# Patient Record
Sex: Male | Born: 1960
Health system: Southern US, Community
[De-identification: ages and names within clinical notes are randomized; demographics above are authoritative.]

## PROBLEM LIST (undated history)

## (undated) DIAGNOSIS — M1990 Unspecified osteoarthritis, unspecified site: Secondary | ICD-10-CM

## (undated) DIAGNOSIS — Z8719 Personal history of other diseases of the digestive system: Secondary | ICD-10-CM

## (undated) DIAGNOSIS — E785 Hyperlipidemia, unspecified: Secondary | ICD-10-CM

## (undated) HISTORY — PX: FINGER SURGERY: SHX640

## (undated) HISTORY — DX: Hyperlipidemia, unspecified: E78.5

---

## 1999-02-17 ENCOUNTER — Ambulatory Visit (HOSPITAL_COMMUNITY): Admission: RE | Admit: 1999-02-17 | Discharge: 1999-02-18 | Payer: Self-pay | Admitting: *Deleted

## 2000-10-10 ENCOUNTER — Emergency Department (HOSPITAL_COMMUNITY): Admission: EM | Admit: 2000-10-10 | Discharge: 2000-10-10 | Payer: Self-pay | Admitting: Family Medicine

## 2000-11-18 ENCOUNTER — Encounter: Payer: Self-pay | Admitting: *Deleted

## 2000-11-18 ENCOUNTER — Emergency Department (HOSPITAL_COMMUNITY): Admission: EM | Admit: 2000-11-18 | Discharge: 2000-11-19 | Payer: Self-pay | Admitting: *Deleted

## 2000-11-22 ENCOUNTER — Ambulatory Visit (HOSPITAL_COMMUNITY): Admission: RE | Admit: 2000-11-22 | Discharge: 2000-11-22 | Payer: Self-pay | Admitting: Cardiology

## 2003-01-25 ENCOUNTER — Emergency Department (HOSPITAL_COMMUNITY): Admission: EM | Admit: 2003-01-25 | Discharge: 2003-01-25 | Payer: Self-pay | Admitting: Emergency Medicine

## 2005-02-22 ENCOUNTER — Ambulatory Visit (HOSPITAL_COMMUNITY): Admission: RE | Admit: 2005-02-22 | Discharge: 2005-02-22 | Payer: Self-pay | Admitting: General Surgery

## 2005-12-03 ENCOUNTER — Emergency Department (HOSPITAL_COMMUNITY): Admission: EM | Admit: 2005-12-03 | Discharge: 2005-12-03 | Payer: Self-pay | Admitting: Emergency Medicine

## 2006-10-01 ENCOUNTER — Emergency Department (HOSPITAL_COMMUNITY): Admission: EM | Admit: 2006-10-01 | Discharge: 2006-10-01 | Payer: Self-pay | Admitting: Emergency Medicine

## 2007-06-19 HISTORY — PX: COLONOSCOPY: SHX174

## 2010-10-05 ENCOUNTER — Inpatient Hospital Stay (INDEPENDENT_AMBULATORY_CARE_PROVIDER_SITE_OTHER)
Admission: RE | Admit: 2010-10-05 | Discharge: 2010-10-05 | Disposition: A | Discharge status: Home / Self Care | Payer: Self-pay | Source: Ambulatory Visit | Attending: Family Medicine | Admitting: Family Medicine

## 2010-10-05 DIAGNOSIS — R112 Nausea with vomiting, unspecified: Secondary | ICD-10-CM

## 2010-10-05 LAB — POCT I-STAT, CHEM 8
BUN: 11 mg/dL (ref 6–23)
Calcium, Ion: 1.07 mmol/L — ABNORMAL LOW (ref 1.12–1.32)
Chloride: 102 mEq/L (ref 96–112)
Creatinine, Ser: 1.1 mg/dL (ref 0.4–1.5)
Glucose, Bld: 111 mg/dL — ABNORMAL HIGH (ref 70–99)
HCT: 48 % (ref 39.0–52.0)
Hemoglobin: 16.3 g/dL (ref 13.0–17.0)
Potassium: 3.9 mEq/L (ref 3.5–5.1)
Sodium: 139 mEq/L (ref 135–145)
TCO2: 26 mmol/L (ref 0–100)

## 2011-04-09 ENCOUNTER — Encounter: Payer: Self-pay | Admitting: *Deleted

## 2011-04-09 ENCOUNTER — Emergency Department (HOSPITAL_COMMUNITY)
Admission: EM | Admit: 2011-04-09 | Discharge: 2011-04-09 | Disposition: A | Discharge status: Home / Self Care | Payer: BC Managed Care – PPO | Attending: Emergency Medicine | Admitting: Emergency Medicine

## 2011-04-09 DIAGNOSIS — H6092 Unspecified otitis externa, left ear: Secondary | ICD-10-CM

## 2011-04-09 DIAGNOSIS — G43909 Migraine, unspecified, not intractable, without status migrainosus: Secondary | ICD-10-CM | POA: Insufficient documentation

## 2011-04-09 DIAGNOSIS — H6692 Otitis media, unspecified, left ear: Secondary | ICD-10-CM

## 2011-04-09 DIAGNOSIS — H60399 Other infective otitis externa, unspecified ear: Secondary | ICD-10-CM | POA: Insufficient documentation

## 2011-04-09 DIAGNOSIS — H6690 Otitis media, unspecified, unspecified ear: Secondary | ICD-10-CM | POA: Insufficient documentation

## 2011-04-09 LAB — GLUCOSE, CAPILLARY: Glucose-Capillary: 110 mg/dL — ABNORMAL HIGH (ref 70–99)

## 2011-04-09 MED ORDER — AMOXICILLIN 500 MG PO CAPS: 500.0000 mg | ORAL_CAPSULE | Freq: Three times a day (TID) | ORAL | Status: AC

## 2011-04-09 MED ORDER — NEOMYCIN-POLYMYXIN-HC 3.5-10000-1 OT SUSP: 3.0000 [drp] | Freq: Three times a day (TID) | OTIC | Status: AC

## 2011-04-09 NOTE — ED Provider Notes (Signed)
History   This chart was scribed for Donnetta Hutching, MD by Clarita Crane. The patient was seen in room APA11/APA11 and the patient's care was started at 3:46PM.   CSN: [id number] Arrival date & time: 04/09/2011  2:42 PM   First MD Initiated Contact with Patient 04/09/11 1513      Chief Complaint  Patient presents with  . visual changes    HPI Charles Campbell is a 50 y.o. male who presents to the Emergency Department complaining of constant visual disturbances described as "squigilly lines" onset this morning but currently resolved. Patient states visual disturbances lasted several minutes before being resolved. Patient was at work at time of onset and was evaluated by nurse at work who measured BP of 140/99 and CBG of 88 approximately 3 hours ago. Patient denies h/o of similar visual disturbances previously. Additionally, patient reports having drainage from left ear during examination performed by work nurse which is currently resolved. Patient is a non smoker.    History reviewed. No pertinent past medical history.  Past Surgical History  Procedure Date  . Finger surgery     No family history on file.  History  Substance Use Topics  . Smoking status: Never Smoker   . Smokeless tobacco: Not on file  . Alcohol Use: No      Review of Systems 10 Systems reviewed and are negative for acute change except as noted in the HPI.  Allergies  Review of patient's allergies indicates no known allergies.  Home Medications  No current outpatient prescriptions on file.  BP 143/78  Pulse 72  Temp(Src) 98.2 F (36.8 C) (Oral)  Resp 17  Ht 6\' 5"  (1.956 m)  Wt 240 lb (108.863 kg)  BMI 28.46 kg/m2  SpO2 99%  Physical Exam  Nursing note and vitals reviewed. Constitutional: He is oriented to person, place, and time. He appears well-developed and well-nourished. No distress.  HENT:  Head: Normocephalic and atraumatic.  Right Ear: Tympanic membrane normal.  Left Ear: Tympanic membrane  normal.  Mouth/Throat: Oropharynx is clear and moist.       Left ear canal inflamed, erythematous and slightly boggy. No drainage noted within either ear canal.   Eyes: EOM are normal. Pupils are equal, round, and reactive to light.  Neck: Neck supple. No tracheal deviation present.  Cardiovascular: Normal rate.   Pulmonary/Chest: Effort normal. No respiratory distress.  Abdominal: He exhibits no distension.  Musculoskeletal: Normal range of motion. He exhibits no edema.  Lymphadenopathy:    He has no cervical adenopathy.  Neurological: He is alert and oriented to person, place, and time. No sensory deficit.       Gait normal.   Skin: Skin is warm and dry.  Psychiatric: He has a normal mood and affect. His behavior is normal.    ED Course  Procedures (including critical care time)  DIAGNOSTIC STUDIES: Oxygen Saturation is 99% on room air, normal by my interpretation.    COORDINATION OF CARE:    Labs Reviewed  GLUCOSE, CAPILLARY - Abnormal; Notable for the following:    Glucose-Capillary 110 (*)    All other components within normal limits   No results found.   No diagnosis found.    MDM  Patient has small amount of drainage and left external canal. Also a component of otitis media. This could have contributed to his problems this morning. Additionally he could have had a migraine headache. Otherwise physical exam completely normal. We'll prescribe amoxicillin and Cortisporin otic  I  personally performed the services described in this documentation, which was scribed in my presence. The recorded information has been reviewed and considered.        Donnetta Hutching, MD 04/09/11 [phone]

## 2011-04-09 NOTE — ED Notes (Signed)
Pt called to tx area third time, pt found in waiting area

## 2011-04-09 NOTE — ED Notes (Signed)
Pt states he began seeing "wavy" at 1100. Pt states headache. Nausea. Occupational nurse sent note stating "Left ear position dizziness"  CBG at 1235 was 88 per note. Pt states drainage to left ear and right ear is stopped up, per pt...per nurse.

## 2011-11-08 ENCOUNTER — Other Ambulatory Visit (HOSPITAL_COMMUNITY): Payer: Self-pay | Admitting: Oral Surgery

## 2011-12-13 NOTE — H&P (Signed)
HISTORY AND PHYSICAL  Charles Campbell is a 51 y.o. male patient with CC: painful decayed teeth.  No diagnosis found.  No past medical history on file.  No current facility-administered medications for this encounter.   Current Outpatient Prescriptions  Medication Sig Dispense Refill  . cyanocobalamin 2000 MCG tablet Take 2,000 mcg by mouth daily. Vitamin B-12       . Multiple Vitamins-Minerals (CENTRUM SILVER ULTRA MENS) TABS Take 1 tablet by mouth daily.         No Known Allergies Active Problems:  * No active hospital problems. *   Vitals: There were no vitals taken for this visit. Lab results:No results found for this or any previous visit (from the past 24 hour(s)). Radiology Results: No results found. General appearance: alert, cooperative, no distress and mildly obese Head: Normocephalic, without obvious abnormality, atraumatic Eyes: negative Nose: Nares normal. Septum midline. Mucosa normal. No drainage or sinus tenderness. Throat: gross caries teeth #'s 17, 20, 21, 22, 23, 24, 25, 26, 27, 29 Neck: no adenopathy and supple, symmetrical, trachea midline Resp: clear to auscultation bilaterally Cardio: regular rate and rhythm, S1, S2 normal, no murmur, click, rub or gallop GI: soft, non-tender; bowel sounds normal; no masses,  no organomegaly  Assessment:50 WM with non-restorable  teeth #'s 17, 20, 21, 22, 23, 24, 25, 26, 27, 29.  Plan: Extraction  teeth #'s 17, 20, 21, 22, 23, 24, 25, 26, 27, 29, alveoloplasty right and left mandible. General anesthesia. Day surgery.   [name] 12/13/2011

## 2011-12-17 ENCOUNTER — Encounter (HOSPITAL_COMMUNITY): Payer: Self-pay | Admitting: Respiratory Therapy

## 2011-12-18 ENCOUNTER — Encounter (HOSPITAL_COMMUNITY): Payer: Self-pay

## 2011-12-18 ENCOUNTER — Encounter (HOSPITAL_COMMUNITY)
Admission: RE | Admit: 2011-12-18 | Discharge: 2011-12-18 | Disposition: A | Discharge status: Home / Self Care | Payer: BC Managed Care – PPO | Source: Ambulatory Visit | Attending: Oral Surgery | Admitting: Oral Surgery

## 2011-12-18 HISTORY — DX: Unspecified osteoarthritis, unspecified site: M19.90

## 2011-12-18 HISTORY — DX: Personal history of other diseases of the digestive system: Z87.19

## 2011-12-18 LAB — CBC
HCT: 44.8 % (ref 39.0–52.0)
Hemoglobin: 15.5 g/dL (ref 13.0–17.0)
MCH: 30.4 pg (ref 26.0–34.0)
MCHC: 34.6 g/dL (ref 30.0–36.0)
MCV: 87.8 fL (ref 78.0–100.0)
Platelets: 216 10*3/uL (ref 150–400)
RBC: 5.1 MIL/uL (ref 4.22–5.81)
RDW: 13 % (ref 11.5–15.5)
WBC: 6.3 10*3/uL (ref 4.0–10.5)

## 2011-12-18 NOTE — Pre-Procedure Instructions (Addendum)
Charles Campbell  12/18/2011   Your procedure is scheduled on:  Monday December 24, 2011.  Report to [facility] at 0900 AM.  Call this number if you have problems the morning of surgery: [phone]   Remember:   Do not eat food or drink:After Midnight.    Take these medicines the morning of surgery with A SIP OF WATER:  Stop taking any Aspirin, Ibuprofen or Herbal medications.  Stop Fish Oil and Vitaminis.  Do not wear jewelry or cologne.  Do not wear lotions  Men may shave face and neck.  Do not bring valuables to the hospital.  Contacts, dentures or bridgework may not be worn into surgery.  Leave suitcase in the car. After surgery it may be brought to your room.  For patients admitted to the hospital, checkout time is 11:00 AM the day of discharge.   Patients discharged the day of surgery will not be allowed to drive home.  Name and phone number of your driver:   Special Instructions: CHG Shower Use Special Wash: 1/2 bottle night before surgery and 1/2 bottle morning of surgery.   Please read over the following fact sheets that you were given: Pain Booklet, Coughing and Deep Breathing, MRSA Information and Surgical Site Infection Prevention

## 2011-12-24 ENCOUNTER — Encounter (HOSPITAL_COMMUNITY): Payer: Self-pay | Admitting: Anesthesiology

## 2011-12-24 ENCOUNTER — Encounter (HOSPITAL_COMMUNITY): Payer: Self-pay | Admitting: *Deleted

## 2011-12-24 ENCOUNTER — Ambulatory Visit (HOSPITAL_COMMUNITY): Payer: Self-pay | Admitting: Anesthesiology

## 2011-12-24 ENCOUNTER — Ambulatory Visit (HOSPITAL_COMMUNITY)
Admission: RE | Admit: 2011-12-24 | Discharge: 2011-12-24 | Disposition: A | Discharge status: Home / Self Care | Payer: Self-pay | Source: Ambulatory Visit | Attending: Oral Surgery | Admitting: Oral Surgery

## 2011-12-24 ENCOUNTER — Encounter (HOSPITAL_COMMUNITY)
Admission: RE | Disposition: A | Discharge status: Home / Self Care | Payer: Self-pay | Source: Ambulatory Visit | Attending: Oral Surgery

## 2011-12-24 DIAGNOSIS — Z01812 Encounter for preprocedural laboratory examination: Secondary | ICD-10-CM | POA: Insufficient documentation

## 2011-12-24 DIAGNOSIS — K029 Dental caries, unspecified: Secondary | ICD-10-CM | POA: Insufficient documentation

## 2011-12-24 HISTORY — PX: MULTIPLE EXTRACTIONS WITH ALVEOLOPLASTY: SHX5342

## 2011-12-24 SURGERY — MULTIPLE EXTRACTION WITH ALVEOLOPLASTY
Anesthesia: General | Site: Mouth | Laterality: Bilateral | Wound class: Clean Contaminated

## 2011-12-24 MED ORDER — DEXAMETHASONE SODIUM PHOSPHATE 4 MG/ML IJ SOLN
INTRAMUSCULAR | Status: DC | PRN
  Administered 2011-12-24: 8 mg via INTRAVENOUS

## 2011-12-24 MED ORDER — LIDOCAINE-EPINEPHRINE 2 %-1:100000 IJ SOLN
INTRAMUSCULAR | Status: DC | PRN
  Administered 2011-12-24: 4.5 mL

## 2011-12-24 MED ORDER — CEFAZOLIN SODIUM 1-5 GM-% IV SOLN
INTRAVENOUS | Status: DC | PRN
  Administered 2011-12-24: 2 g via INTRAVENOUS

## 2011-12-24 MED ORDER — OXYMETAZOLINE HCL 0.05 % NA SOLN
NASAL | Status: DC | PRN
  Administered 2011-12-24: 1 via NASAL

## 2011-12-24 MED ORDER — MIDAZOLAM HCL 5 MG/5ML IJ SOLN
INTRAMUSCULAR | Status: DC | PRN
  Administered 2011-12-24: 2 mg via INTRAVENOUS

## 2011-12-24 MED ORDER — SODIUM CHLORIDE 0.9 % IR SOLN
Status: DC | PRN
  Administered 2011-12-24: 1

## 2011-12-24 MED ORDER — CEFAZOLIN SODIUM 1-5 GM-% IV SOLN
INTRAVENOUS | Status: AC
  Filled 2011-12-24: qty 100

## 2011-12-24 MED ORDER — LACTATED RINGERS IV SOLN
INTRAVENOUS | Status: DC
  Administered 2011-12-24: 10:00:00 via INTRAVENOUS

## 2011-12-24 MED ORDER — LACTATED RINGERS IV SOLN
INTRAVENOUS | Status: DC | PRN
  Administered 2011-12-24: 12:00:00 via INTRAVENOUS

## 2011-12-24 MED ORDER — PROPOFOL 10 MG/ML IV EMUL
INTRAVENOUS | Status: DC | PRN
  Administered 2011-12-24: 200 mg via INTRAVENOUS

## 2011-12-24 MED ORDER — OXYMETAZOLINE HCL 0.05 % NA SOLN
NASAL | Status: AC
  Filled 2011-12-24: qty 15

## 2011-12-24 MED ORDER — SUCCINYLCHOLINE CHLORIDE 20 MG/ML IJ SOLN
INTRAMUSCULAR | Status: DC | PRN
  Administered 2011-12-24: 100 mg via INTRAVENOUS

## 2011-12-24 MED ORDER — OXYMETAZOLINE HCL 0.05 % NA SOLN
NASAL | Status: DC | PRN
  Administered 2011-12-24 (×2): 2 via NASAL
  Administered 2011-12-24: 1 via NASAL

## 2011-12-24 MED ORDER — FENTANYL CITRATE 0.05 MG/ML IJ SOLN
INTRAMUSCULAR | Status: DC | PRN
  Administered 2011-12-24: 100 ug via INTRAVENOUS

## 2011-12-24 MED ORDER — ONDANSETRON HCL 4 MG/2ML IJ SOLN
INTRAMUSCULAR | Status: DC | PRN
  Administered 2011-12-24: 4 mg via INTRAVENOUS

## 2011-12-24 MED ORDER — OXYCODONE-ACETAMINOPHEN 5-325 MG PO TABS: 1.0000 | ORAL_TABLET | ORAL | Status: AC | PRN

## 2011-12-24 MED ORDER — LIDOCAINE-EPINEPHRINE 2 %-1:100000 IJ SOLN
INTRAMUSCULAR | Status: AC
  Filled 2011-12-24: qty 1

## 2011-12-24 SURGICAL SUPPLY — 29 items
BUR CROSS CUT FISSURE 1.6 (BURR) ×2 IMPLANT
BUR EGG ELITE 4.0 (BURR) ×2 IMPLANT
CANISTER SUCTION 2500CC (MISCELLANEOUS) ×2 IMPLANT
CLOTH BEACON ORANGE TIMEOUT ST (SAFETY) ×2 IMPLANT
COVER SURGICAL LIGHT HANDLE (MISCELLANEOUS) ×2 IMPLANT
CRADLE DONUT ADULT HEAD (MISCELLANEOUS) ×2 IMPLANT
DECANTER SPIKE VIAL GLASS SM (MISCELLANEOUS) ×2 IMPLANT
GAUZE PACKING FOLDED 2  STR (GAUZE/BANDAGES/DRESSINGS) ×1
GAUZE PACKING FOLDED 2 STR (GAUZE/BANDAGES/DRESSINGS) ×1 IMPLANT
GLOVE BIO SURGEON STRL SZ 6.5 (GLOVE) ×2 IMPLANT
GLOVE BIO SURGEON STRL SZ7 (GLOVE) ×1 IMPLANT
GLOVE BIO SURGEON STRL SZ7.5 (GLOVE) ×2 IMPLANT
GLOVE BIOGEL PI IND STRL 7.0 (GLOVE) ×1 IMPLANT
GLOVE BIOGEL PI INDICATOR 7.0 (GLOVE) ×2
GOWN STRL NON-REIN LRG LVL3 (GOWN DISPOSABLE) ×4 IMPLANT
GOWN STRL REIN XL XLG (GOWN DISPOSABLE) ×2 IMPLANT
KIT BASIN OR (CUSTOM PROCEDURE TRAY) ×2 IMPLANT
KIT ROOM TURNOVER OR (KITS) ×2 IMPLANT
NEEDLE 22X1 1/2 (OR ONLY) (NEEDLE) ×2 IMPLANT
NS IRRIG 1000ML POUR BTL (IV SOLUTION) ×2 IMPLANT
PAD ARMBOARD 7.5X6 YLW CONV (MISCELLANEOUS) ×4 IMPLANT
SUT CHROMIC 3 0 PS 2 (SUTURE) ×2 IMPLANT
SYR 50ML SLIP (SYRINGE) IMPLANT
SYR CONTROL 10ML LL (SYRINGE) ×1 IMPLANT
TOWEL OR 17X26 10 PK STRL BLUE (TOWEL DISPOSABLE) ×2 IMPLANT
TRAY ENT MC OR (CUSTOM PROCEDURE TRAY) ×2 IMPLANT
TUBING IRRIGATION (MISCELLANEOUS) ×1 IMPLANT
WATER STERILE IRR 1000ML POUR (IV SOLUTION) IMPLANT
YANKAUER SUCT BULB TIP NO VENT (SUCTIONS) ×2 IMPLANT

## 2011-12-24 NOTE — Preoperative (Signed)
Beta Blockers   Reason not to administer Beta Blockers:Not Applicable 

## 2011-12-24 NOTE — Anesthesia Postprocedure Evaluation (Signed)
Anesthesia Post Note  Patient: Charles Campbell  Procedure(s) Performed: Procedure(s) (LRB): MULTIPLE EXTRACION WITH ALVEOLOPLASTY (Bilateral)  Anesthesia type: general  Patient location: PACU  Post pain: Pain level controlled  Post assessment: Patient's Cardiovascular Status Stable  Last Vitals:  Filed Vitals:   12/24/11 1459  BP: 147/90  Pulse: 71  Temp: 36.2 C  Resp: 18    Post vital signs: Reviewed and stable  Level of consciousness: sedated  Complications: No apparent anesthesia complications

## 2011-12-24 NOTE — Op Note (Signed)
Charles Campbell, Charles Campbell NO.:  [id number]  MEDICAL RECORD NO.:  [id number]  LOCATION:  [facility]                         FACILITY:  [facility]  PHYSICIAN:  [name], M.D.  DATE OF BIRTH:  Jan 17, 1961  DATE OF PROCEDURE:  12/24/2011 DATE OF DISCHARGE:  12/24/2011                              OPERATIVE REPORT   PREOPERATIVE DIAGNOSIS:  Nonrestorable teeth numbers 17, 20, 21, 22, 23, 24, 25, 26, 27, 29.  POSTOPERATIVE DIAGNOSIS:  Nonrestorable teeth numbers 17, 20, 21, 22, 23, 24, 25, 26, 27, 29.  PROCEDURE:  Removal of nonrestorable teeth numbers 17, 20, 21, 22, 23, 24, 25, 26, 27, 29.  Alveoplasty right and left mandible.  SURGEONS:  [name], M.D.  ANESTHESIA:  General nasal, Dr. Krista Blue attending.  INDICATIONS FOR PROCEDURE:  Charles Campbell is a 51 year old male, who is referred by his general dentist to remove all remaining lower teeth, secondary to dental caries.  Charles Campbell has past medical history significant for hernia, but is otherwise healthy; however, because of the number of teeth to be removed, the necessity of alveoplasty, the patient requested general anesthesia.  The [facility] was [facility] so that the patient could be intubated for airway protection because of possible blood loss and risk of aspiration if the procedure was done at the office.  PROCEDURE:  The patient was taken to the operating room, placed on the table in supine position.  General anesthesia was administered intravenously, and a nasal endotracheal tube was placed and secured. The patient was draped for the procedure.  Time-out was performed.  The posterior pharynx was suctioned.  A throat pack was placed.  2% lidocaine 1:100,000 epinephrine was infiltrated in an inferior alveolar block on the right and left side and buccal infiltration in the anterior mandible.  A total of 4.5 mL was utilized.  Then, a bite block was placed on the right side of the mouth and a  sweetheart retractor was used to retract the tongue.  A 15 blade was used to make a full- thickness incision beginning at tooth #17 carrying anteriorly on the crest to the mandibular ridge to teeth numbers 22, 23, 24, 25, 26, and 27.  The incision was made buccally and lingually along the papilla of these teeth.  Then, the periosteal elevator was used to reflect the periosteum so that the alveolar bone was exposed and a 301 elevator was used to elevate the teeth, and the teeth were removed from the mouth with the Ash forceps and the rongeurs.  The sockets were curetted and then the periosteum was further reflected to expose the alveolar crest, the egg-shaped bur, and bone file were used to perform the alveoplasty. The area was then irrigated and closed with 3-0 chromic.  The bite block and sweetheart retractor were repositioned to the other side of the mouth, and a 15 blade was used to make an incision beginning distal to tooth #29 and carrying across the crest of the ridge to the incision artery fabricated at tooth #27.  Then, the periosteum was reflected and tooth #29 was removed using a 301 elevator and the rongeurs.  Then, the periosteum was  further reflected to expose the alveolar crest.  This was trimmed with the egg-shaped bur and a bone file.  Then, the area was irrigated and closed with 3-0 chromic.  The oral cavity was inspected and found to have good contour and closure.  The oral cavity was irrigated, suctioned, the throat pack was removed.  The patient was extubated, awakened and taken to the recovery room, breathing spontaneously in good condition.  EBL:  Minimum.  COMPLICATIONS:  None.  SPECIMENS:  None.     [name], M.D.     SMJ/MEDQ  D:  12/24/2011  T:  12/24/2011  Job:  [id number]

## 2011-12-24 NOTE — Anesthesia Preprocedure Evaluation (Addendum)
Anesthesia Evaluation  Patient identified by MRN, date of birth, ID band Patient awake    Reviewed: Allergy & Precautions, H&P , NPO status , Patient's Chart, lab work & pertinent test results  History of Anesthesia Complications Negative for: history of anesthetic complications  Airway Mallampati: II TM Distance: >3 FB Neck ROM: Full    Dental No notable dental hx. (+) Edentulous Upper, Poor Dentition and Dental Advisory Given   Pulmonary neg pulmonary ROS,  breath sounds clear to auscultation  Pulmonary exam normal       Cardiovascular negative cardio ROS  Rhythm:Regular Rate:Normal     Neuro/Psych negative neurological ROS  negative psych ROS   GI/Hepatic Neg liver ROS, hiatal hernia,   Endo/Other  negative endocrine ROS  Renal/GU negative Renal ROS  negative genitourinary   Musculoskeletal   Abdominal   Peds  Hematology negative hematology ROS (+)   Anesthesia Other Findings   Reproductive/Obstetrics negative OB ROS                          Anesthesia Physical Anesthesia Plan  ASA: II  Anesthesia Plan: General   Post-op Pain Management:    Induction: Intravenous  Airway Management Planned: Nasal ETT  Additional Equipment:   Intra-op Plan:   Post-operative Plan: Extubation in OR  Informed Consent: I have reviewed the patients History and Physical, chart, labs and discussed the procedure including the risks, benefits and alternatives for the proposed anesthesia with the patient or authorized representative who has indicated his/her understanding and acceptance.   Dental advisory given  Plan Discussed with: CRNA, Anesthesiologist and Surgeon  Anesthesia Plan Comments:        Anesthesia Quick Evaluation

## 2011-12-24 NOTE — Op Note (Signed)
12/24/2011  12:50 PM  PATIENT:  Charles Campbell  51 y.o. male  PRE-OPERATIVE DIAGNOSIS:  Non-Restorable Teeth #'s 17, 20, 21, 22, 23, 24, 25, 26, 27, 29  POST-OPERATIVE DIAGNOSIS:  SAME  PROCEDURE:  Procedure(s): MULTIPLE EXTRACTION Non-Restorable Teeth #'s 17, 20, 21, 22, 23, 24, 25, 26, 27, 29,  ALVEOLOPLASTY Right and Left mandible  SURGEON:  Surgeon(s): [name], DDS  ANESTHESIA:   local and general  EBL:  minimal  DRAINS: none   SPECIMEN:  No Specimen  COUNTS:  YES  PLAN OF CARE: Discharge to home after PACU  PATIENT DISPOSITION:  PACU - hemodynamically stable.   PROCEDURE DETAILS: Dictation # [id number]  [name], DMD 12/24/2011 12:50 PM

## 2011-12-24 NOTE — H&P (Signed)
H&P documentation  -History and Physical Reviewed  -Patient has been re-examined  -No change in the plan of care  Charles Campbell M  

## 2011-12-24 NOTE — Transfer of Care (Signed)
Immediate Anesthesia Transfer of Care Note  Patient: Charles Campbell  Procedure(s) Performed: Procedure(s) (LRB): MULTIPLE EXTRACION WITH ALVEOLOPLASTY (Bilateral)  Patient Location: PACU  Anesthesia Type: General  Level of Consciousness: awake, alert  and oriented  Airway & Oxygen Therapy: Patient Spontanous Breathing and Patient connected to nasal cannula oxygen  Post-op Assessment: Report given to PACU RN and Post -op Vital signs reviewed and stable  Post vital signs: Reviewed and stable  Complications: No apparent anesthesia complications

## 2011-12-25 ENCOUNTER — Encounter (HOSPITAL_COMMUNITY): Payer: Self-pay | Admitting: Oral Surgery

## 2016-06-27 ENCOUNTER — Emergency Department (HOSPITAL_COMMUNITY): Payer: BLUE CROSS/BLUE SHIELD

## 2016-06-27 ENCOUNTER — Encounter (HOSPITAL_COMMUNITY): Payer: Self-pay

## 2016-06-27 ENCOUNTER — Emergency Department (HOSPITAL_COMMUNITY)
Admission: EM | Admit: 2016-06-27 | Discharge: 2016-06-27 | Disposition: A | Discharge status: Home / Self Care | Payer: BLUE CROSS/BLUE SHIELD | Attending: Emergency Medicine | Admitting: Emergency Medicine

## 2016-06-27 DIAGNOSIS — Y990 Civilian activity done for income or pay: Secondary | ICD-10-CM | POA: Insufficient documentation

## 2016-06-27 DIAGNOSIS — S0990XA Unspecified injury of head, initial encounter: Secondary | ICD-10-CM

## 2016-06-27 DIAGNOSIS — W19XXXA Unspecified fall, initial encounter: Secondary | ICD-10-CM | POA: Diagnosis not present

## 2016-06-27 DIAGNOSIS — R42 Dizziness and giddiness: Secondary | ICD-10-CM | POA: Diagnosis not present

## 2016-06-27 DIAGNOSIS — Y929 Unspecified place or not applicable: Secondary | ICD-10-CM | POA: Diagnosis not present

## 2016-06-27 DIAGNOSIS — S0001XA Abrasion of scalp, initial encounter: Secondary | ICD-10-CM | POA: Diagnosis not present

## 2016-06-27 DIAGNOSIS — R404 Transient alteration of awareness: Secondary | ICD-10-CM | POA: Diagnosis not present

## 2016-06-27 DIAGNOSIS — Z7982 Long term (current) use of aspirin: Secondary | ICD-10-CM | POA: Diagnosis not present

## 2016-06-27 DIAGNOSIS — Y939 Activity, unspecified: Secondary | ICD-10-CM | POA: Diagnosis not present

## 2016-06-27 DIAGNOSIS — R110 Nausea: Secondary | ICD-10-CM | POA: Diagnosis not present

## 2016-06-27 DIAGNOSIS — R402411 Glasgow coma scale score 13-15, in the field [EMT or ambulance]: Secondary | ICD-10-CM | POA: Diagnosis not present

## 2016-06-27 LAB — CBC WITH DIFFERENTIAL/PLATELET
Basophils Absolute: 0 10*3/uL (ref 0.0–0.1)
Basophils Relative: 0 %
Eosinophils Absolute: 0.1 10*3/uL (ref 0.0–0.7)
Eosinophils Relative: 1 %
HCT: 47.4 % (ref 39.0–52.0)
Hemoglobin: 15.9 g/dL (ref 13.0–17.0)
Lymphocytes Relative: 5 %
Lymphs Abs: 0.6 10*3/uL — ABNORMAL LOW (ref 0.7–4.0)
MCH: 30.2 pg (ref 26.0–34.0)
MCHC: 33.5 g/dL (ref 30.0–36.0)
MCV: 90.1 fL (ref 78.0–100.0)
Monocytes Absolute: 0.3 10*3/uL (ref 0.1–1.0)
Monocytes Relative: 3 %
Neutro Abs: 11 10*3/uL — ABNORMAL HIGH (ref 1.7–7.7)
Neutrophils Relative %: 91 %
Platelets: 197 10*3/uL (ref 150–400)
RBC: 5.26 MIL/uL (ref 4.22–5.81)
RDW: 13.6 % (ref 11.5–15.5)
WBC: 12 10*3/uL — ABNORMAL HIGH (ref 4.0–10.5)

## 2016-06-27 LAB — BASIC METABOLIC PANEL
Anion gap: 9 (ref 5–15)
BUN: 15 mg/dL (ref 6–20)
CO2: 26 mmol/L (ref 22–32)
Calcium: 8.8 mg/dL — ABNORMAL LOW (ref 8.9–10.3)
Chloride: 105 mmol/L (ref 101–111)
Creatinine, Ser: 1.16 mg/dL (ref 0.61–1.24)
GFR calc Af Amer: >60 mL/min (ref >60)
GFR calc non Af Amer: >60 mL/min (ref >60)
Glucose, Bld: 102 mg/dL — ABNORMAL HIGH (ref 65–99)
Potassium: 3.9 mmol/L (ref 3.5–5.1)
Sodium: 140 mmol/L (ref 135–145)

## 2016-06-27 LAB — CBG MONITORING, ED: Glucose-Capillary: 123 mg/dL — ABNORMAL HIGH (ref 65–99)

## 2016-06-27 MED ORDER — ONDANSETRON HCL 4 MG/2ML IJ SOLN
4.0000 mg | Freq: Once | INTRAMUSCULAR | Status: AC
  Administered 2016-06-27: 4 mg via INTRAVENOUS
  Filled 2016-06-27: qty 2

## 2016-06-27 MED ORDER — ONDANSETRON 4 MG PO TBDP: 4.0000 mg | ORAL_TABLET | Freq: Once | ORAL | Status: DC

## 2016-06-27 NOTE — ED Triage Notes (Signed)
Pt from work post fall unknown reason why CBG 120 no history of recent falls. Pt was found unresponsive at first then came around. Pt no problem in past memory, but doesn't remember the fall. Pt repetive in speech and questions.

## 2016-06-27 NOTE — ED Notes (Signed)
Patient transported to CT 

## 2016-06-27 NOTE — ED Provider Notes (Signed)
[facility] Provider Note    By signing my name below, I, Charles Campbell, attest that this documentation has been prepared under the direction and in the presence of [name], MD. Electronically Signed: Earmon Campbell, ED Scribe. 06/27/16. 2:04 PM.    History   Chief Complaint Chief Complaint  Patient presents with  . Fall    The history is provided by the patient, medical records and the EMS personnel. No language interpreter was used.    HPI Comments:  Charles Campbell is a 56 y.o. male, brought in by EMS, who presents to the Emergency Department complaining of an unwitnessed fall that occurred at work PTA. He states he felt "excellent" this morning when he went into work. He does not remember the fall and was found down and unresponsive in the break room at his place of employment. He states he does not remember going into the break room. He reports associated HA, dizziness and nausea. He was not given anything for pain PTA. Pt denies modifying factors. He denies neck pain, visual changes, numbness, tingling or weakness of any extremity. He states his tetanus vaccination was 4-5 years ago. He denies h/o DM. He does not have a PCP and does not see any doctors regularly.  Past Medical History:  Diagnosis Date  . Arthritis    Wrist  . H/O hiatal hernia    watches diet    There are no active problems to display for this patient.   Past Surgical History:  Procedure Laterality Date  . COLONOSCOPY    . FINGER SURGERY     Left index- due to injury  . MULTIPLE EXTRACTIONS WITH ALVEOLOPLASTY  12/24/2011   Procedure: MULTIPLE EXTRACION WITH ALVEOLOPLASTY;  Surgeon: [name], DDS;  Location: [facility] OR;  Service: Oral Surgery;  Laterality: Bilateral;       Home Medications    Prior to Admission medications   Medication Sig Start Date End Date Taking? Authorizing Provider  aspirin 325 MG tablet Take 325 mg by mouth daily.    Historical Provider, MD  fish  oil-omega-3 fatty acids 1000 MG capsule Take 1 g by mouth daily.    Historical Provider, MD  Multiple Vitamins-Minerals (CENTRUM SILVER ULTRA MENS) TABS Take 1 tablet by mouth daily.      Historical Provider, MD  vitamin C (ASCORBIC ACID) 500 MG tablet Take 500 mg by mouth daily.    Historical Provider, MD    Family History History reviewed. No pertinent family history.  Social History Social History  Substance Use Topics  . Smoking status: Never Smoker  . Smokeless tobacco: Never Used  . Alcohol use No     Allergies   Patient has no known allergies.   Review of Systems Review of Systems A complete 10 system review of systems was obtained and all systems are negative except as noted in the HPI and PMH.    Physical Exam Updated Vital Signs BP 150/83 (BP Location: Right Arm)   Pulse 97   Temp 98 F (36.7 C) (Oral)   Resp 16   Ht 6\' 5"  (1.956 m)   Wt 240 lb (108.9 kg)   SpO2 100%   BMI 28.46 kg/m   Physical Exam  Constitutional: He is oriented to person, place, and time. He appears well-developed and well-nourished.  HENT:  Head: Normocephalic.  Posterior scalp hematoma with overlying abrasion. Minimal bleeding. Nothing amenable to suturing.  Eyes: EOM are normal.  Neck: Normal range of motion.  Cardiovascular:  Normal rate, regular rhythm and normal heart sounds.  Exam reveals no gallop and no friction rub.   No murmur heard. Pulmonary/Chest: Effort normal and breath sounds normal. No respiratory distress. He has no wheezes. He has no rales.  Abdominal: He exhibits no distension.  Musculoskeletal: Normal range of motion. He exhibits no edema, tenderness or deformity.  No midline spinal tenderness.  Neurological: He is alert and oriented to person, place, and time. He displays normal reflexes. No cranial nerve deficit or sensory deficit. He exhibits normal muscle tone. Coordination normal.  Psychiatric: He has a normal mood and affect.  Nursing note and vitals  reviewed.    ED Treatments / Results  DIAGNOSTIC STUDIES: Oxygen Saturation is 100% on RA, normal by my interpretation.   COORDINATION OF CARE: 12:58 PM- Advised pt of CT results. Will order labs. Pt verbalizes understanding and agrees to plan.  Medications  ondansetron (ZOFRAN) injection 4 mg (4 mg Intravenous Given 06/27/16 1218)    Labs (all labs ordered are listed, but only abnormal results are displayed) Labs Reviewed  CBC WITH DIFFERENTIAL/PLATELET - Abnormal; Notable for the following:       Result Value   WBC 12.0 (*)    Neutro Abs 11.0 (*)    Lymphs Abs 0.6 (*)    All other components within normal limits  BASIC METABOLIC PANEL - Abnormal; Notable for the following:    Glucose, Bld 102 (*)    Calcium 8.8 (*)    All other components within normal limits  CBG MONITORING, ED - Abnormal; Notable for the following:    Glucose-Capillary 123 (*)    All other components within normal limits    EKG  EKG Interpretation  Date/Time:  Wednesday June 27 2016 12:06:27 EST Ventricular Rate:  96 PR Interval:    QRS Duration: 103 QT Interval:  354 QTC Calculation: 448 R Axis:   32 Text Interpretation:  Sinus rhythm RSR' in V1 or V2, probably normal variant Confirmed by Juleen China  MD, Garnet Chatmon [phone]) on 06/27/2016 2:36:08 PM       Radiology Ct Head Wo Contrast  Result Date: 06/27/2016 CLINICAL DATA:  Found down after falling. Unresponsive. Altered mental status. EXAM: CT HEAD WITHOUT CONTRAST TECHNIQUE: Contiguous axial images were obtained from the base of the skull through the vertex without intravenous contrast. COMPARISON:  None. FINDINGS: Brain: There is no evidence of acute intracranial hemorrhage, mass lesion, brain edema or extra-axial fluid collection. There is moderate dilatation of the lateral, third and fourth ventricles. The subarachnoid spaces are relatively normal in size. There is no midline shift. Chronic small vessel ischemic changes are present within the  periventricular white matter bilaterally. There is no CT evidence of acute cortical infarction. Vascular: No hyperdense vessel or unexpected calcification. Skull: Negative for fracture or focal lesion. Sinuses/Orbits: Probable small mucous retention cyst dependently in the right maxillary sinus. The visualized paranasal sinuses, mastoid air cells and middle ears are otherwise unremarkable. No orbital abnormalities are seen. Other: None. IMPRESSION: 1. Moderate ventricular dilatation, in excess of sulcal prominence, suspicious for communicating hydrocephalus, potentially normal pressure hydrocephalus in the appropriate clinical context. Correlate clinically. 2. No definite acute findings. Mild chronic small vessel ischemic changes in the periventricular white matter bilaterally. Electronically Signed   By: Carey Bullocks M.D.   On: 06/27/2016 12:41    Procedures Procedures (including critical care time)  Medications Ordered in ED Medications  ondansetron (ZOFRAN) injection 4 mg (4 mg Intravenous Given 06/27/16 1218)  Initial Impression / Assessment and Plan / ED Course  I have reviewed the triage vital signs and the nursing notes.  Pertinent labs & imaging results that were available during my care of the patient were reviewed by me and considered in my medical decision making (see chart for details).  Clinical Course     56 year old male presenting after a fall. Small scalp abrasion with minimal bleeding. It was cleaned. Nothing worth repairing. I expect this should be fine with. Local wound care. Reportedly at baseline mental status. CT of head without acute traumatic abnormality.  Final Clinical Impressions(s) / ED Diagnoses   Final diagnoses:  Closed head injury, initial encounter  Abrasion of scalp, initial encounter  Fall, initial encounter    New Prescriptions New Prescriptions   No medications on file   I personally preformed the services scribed in my presence. The  recorded information has been reviewed is accurate. Raeford RazorStephen Saylee Sherrill, MD.    Raeford RazorStephen Amiya Escamilla, MD 07/09/16 1356

## 2016-07-02 ENCOUNTER — Encounter: Payer: Self-pay | Admitting: Family Medicine

## 2016-07-02 ENCOUNTER — Ambulatory Visit (INDEPENDENT_AMBULATORY_CARE_PROVIDER_SITE_OTHER): Payer: BLUE CROSS/BLUE SHIELD | Admitting: Family Medicine

## 2016-07-02 VITALS — BP 136/80 | HR 76 | Temp 98.1°F | Resp 18 | Ht 77.0 in | Wt 237.1 lb

## 2016-07-02 DIAGNOSIS — Z23 Encounter for immunization: Secondary | ICD-10-CM | POA: Diagnosis not present

## 2016-07-02 DIAGNOSIS — R55 Syncope and collapse: Secondary | ICD-10-CM | POA: Diagnosis not present

## 2016-07-02 DIAGNOSIS — Z9109 Other allergy status, other than to drugs and biological substances: Secondary | ICD-10-CM | POA: Diagnosis not present

## 2016-07-02 DIAGNOSIS — Z7689 Persons encountering health services in other specified circumstances: Secondary | ICD-10-CM

## 2016-07-02 DIAGNOSIS — Z8639 Personal history of other endocrine, nutritional and metabolic disease: Secondary | ICD-10-CM

## 2016-07-02 NOTE — Patient Instructions (Addendum)
Need to see a neurologist  Need to see a cardiologist  Need to come back for a physical and blood work in a couple of weeks  Do not drive until cleared by neurologist

## 2016-07-02 NOTE — Progress Notes (Signed)
Chief Complaint  Patient presents with  . Establish Care   Previously healthy 56 year old No medical care for years Here for ER follow up and new visit  Went to the ER for syncope.  Collapsed at work and found unconscious on floor at work.  Taken to the ER and head Ct, labs, EKG done.   He is well since going home.  Some ringing in the right ear.  No problem with balance, strength, use arms or legs.  No trouble with vision or hearing. No headache.  He did not lose continence bladder/bowels. He felt well before he fainted.  NO warning at all. No history of seizure No heart problems or arrhythmia  CT result:  "IMPRESSION: 1. Moderate ventricular dilatation, in excess of sulcal prominence, suspicious for communicating hydrocephalus, potentially normal pressure hydrocephalus in the appropriate clinical context. Correlate clinically. 2. No definite acute findings. Mild chronic small vessel ischemic changes in the periventricular white matter bilaterally.   Electronically Signed   By: Carey Bullocks M.D."  Patient  States unaware that CT was not normal.  agrees to go to neurology and not drive until cleared.    Patient Active Problem List   Diagnosis Date Noted  . Syncope and collapse 07/02/2016  . Environmental allergies 07/02/2016    Outpatient Encounter Prescriptions as of 07/02/2016  Medication Sig  . aspirin EC 81 MG tablet Take 81 mg by mouth daily.  . Multiple Vitamins-Minerals (CENTRUM SILVER ULTRA MENS) TABS Take 1 tablet by mouth daily.    . vitamin C (ASCORBIC ACID) 500 MG tablet Take 500 mg by mouth daily.   No facility-administered encounter medications on file as of 07/02/2016.     Past Medical History:  Diagnosis Date  . Arthritis    Wrist  . H/O hiatal hernia    watches diet  . Hyperlipidemia     Past Surgical History:  Procedure Laterality Date  . COLONOSCOPY    . FINGER SURGERY     Left index- due to injury  . MULTIPLE EXTRACTIONS WITH  ALVEOLOPLASTY  12/24/2011   Procedure: MULTIPLE EXTRACION WITH ALVEOLOPLASTY;  Surgeon: [name], DDS;  Location: [facility] OR;  Service: Oral Surgery;  Laterality: Bilateral;    Social History   Social History  . Marital status: Divorced    Spouse name: N/A  . Number of children: 1  . Years of education: 41   Occupational History  . material handler    Social History Main Topics  . Smoking status: Never Smoker  . Smokeless tobacco: Never Used  . Alcohol use No  . Drug use: No  . Sexual activity: Not Currently   Other Topics Concern  . Not on file   Social History Narrative   Lives alone   One dog, lab       Family History  Problem Relation Age of Onset  . Cancer Mother     lung  . Asthma Mother   . Heart disease Father 72    heart attack  . Cancer Brother     brain  . Cancer Brother     Review of Systems  Constitutional: Negative for chills, fever and weight loss.  HENT: Positive for tinnitus. Negative for congestion and hearing loss.   Eyes: Negative for blurred vision and pain.  Respiratory: Negative for cough and shortness of breath.   Cardiovascular: Negative for chest pain and leg swelling.  Gastrointestinal: Negative for abdominal pain, constipation, diarrhea and heartburn.  Genitourinary:  Negative for dysuria and frequency.  Musculoskeletal: Negative for falls, joint pain and myalgias.  Neurological: Negative for dizziness, seizures and headaches.       Recent syncope  Psychiatric/Behavioral: Negative for depression. The patient is not nervous/anxious and does not have insomnia.     BP 136/80 (BP Location: Right Arm, Patient Position: Sitting, Cuff Size: Large)   Pulse 76   Temp 98.1 F (36.7 C) (Oral)   Resp 18   Ht 6\' 5"  (1.956 m)   Wt 237 lb 1.3 oz (107.5 kg)   SpO2 100%   BMI 28.11 kg/m   Physical Exam  Constitutional: He is oriented to person, place, and time. He appears well-developed and well-nourished.  HENT:  Head: Normocephalic.    Right Ear: External ear normal.  Left Ear: External ear normal.  Mouth/Throat: Oropharynx is clear and moist.  Healing abrasion post scalp  Eyes: Conjunctivae are normal. Pupils are equal, round, and reactive to light.  Neck: Normal range of motion. Neck supple. No thyromegaly present.  Cardiovascular: Normal rate, regular rhythm and normal heart sounds.   Pulmonary/Chest: Effort normal and breath sounds normal. No respiratory distress.  Abdominal: Soft. Bowel sounds are normal.  Musculoskeletal: Normal range of motion. He exhibits no edema.  Lymphadenopathy:    He has no cervical adenopathy.  Neurological: He is alert and oriented to person, place, and time. He displays normal reflexes.  Gait normal  Skin: Skin is warm and dry.  Psychiatric: He has a normal mood and affect. His behavior is normal. Thought content normal.  Nursing note and vitals reviewed.  ASSESSMENT/PLAN:  1. Syncope and collapse  - Ambulatory referral to Neurology - Ambulatory referral to Cardiology  2. Environmental allergies   3. Need for immunization against influenza  - Flu Vaccine QUAD 36+ mos IM (Fluarix)  4. Encounter to establish care with new doctor   5. History of hyperlipidemia    Patient Instructions  Need to see a neurologist  Need to see a cardiologist  Need to come back for a physical and blood work in a couple of weeks  Do not drive until cleared by neurologist   Eustace MooreYvonne Sue Mavin Dyke, MD

## 2016-07-12 ENCOUNTER — Encounter: Payer: Self-pay | Admitting: Cardiology

## 2016-07-17 ENCOUNTER — Encounter: Payer: Self-pay | Admitting: Family Medicine

## 2016-07-19 ENCOUNTER — Encounter: Payer: Self-pay | Admitting: Family Medicine

## 2016-07-19 ENCOUNTER — Ambulatory Visit (INDEPENDENT_AMBULATORY_CARE_PROVIDER_SITE_OTHER): Payer: BLUE CROSS/BLUE SHIELD | Admitting: Family Medicine

## 2016-07-19 VITALS — BP 122/80 | HR 92 | Temp 98.0°F | Resp 18 | Ht 77.0 in | Wt 228.0 lb

## 2016-07-19 DIAGNOSIS — Z0000 Encounter for general adult medical examination without abnormal findings: Secondary | ICD-10-CM | POA: Diagnosis not present

## 2016-07-19 NOTE — Patient Instructions (Signed)
Need blood work today I will send you a letter with your test results.  If there is anything of concern, we will call right away.  You need to call [facility] to set up an appointment Their number is : [phone]  [facility] [place] This is cardiology to check your heart Their number is:  [phone]  See me in 3 months Call sooner for problems

## 2016-07-19 NOTE — Progress Notes (Signed)
Chief Complaint  Patient presents with  . Annual Exam   Labs today Declines PSA screen Colonoscopy done 2009 Has not yet seen cardiology or neurology Is reminded to make these appts Weight is stable  After head injury he states: 1. Dentures do not fit and 2. Chronic cough.  No headache or visual impairment.  No trouble with cognition or memory.  Went to the urgent care doctor for clearance to go back to work and driving.   Patient Active Problem List   Diagnosis Date Noted  . Syncope and collapse 07/02/2016  . Environmental allergies 07/02/2016    Outpatient Encounter Prescriptions as of 07/19/2016  Medication Sig  . aspirin EC 81 MG tablet Take 81 mg by mouth daily.  . Multiple Vitamins-Minerals (CENTRUM SILVER ULTRA MENS) TABS Take 1 tablet by mouth daily.    . vitamin C (ASCORBIC ACID) 500 MG tablet Take 500 mg by mouth daily.   No facility-administered encounter medications on file as of 07/19/2016.     No Known Allergies  Review of Systems  Constitutional: Negative for activity change, appetite change and unexpected weight change.  HENT: Positive for dental problem. Negative for congestion.        "Teeth don't fit"  Eyes: Negative for photophobia and visual disturbance.  Respiratory: Positive for cough. Negative for shortness of breath and wheezing.   Cardiovascular: Negative for chest pain, palpitations and leg swelling.  Gastrointestinal: Negative for constipation and diarrhea.  Genitourinary: Negative for difficulty urinating and dysuria.  Musculoskeletal: Negative for arthralgias, back pain and gait problem.  Neurological: Negative.  Negative for dizziness, seizures, syncope and light-headedness.       No more "spells"  Psychiatric/Behavioral: Negative for behavioral problems, decreased concentration and dysphoric mood.   BP 122/80 (BP Location: Right Arm, Patient Position: Sitting, Cuff Size: Large)   Pulse 92   Temp 98 F (36.7 C) (Oral)   Resp 18   Ht  6\' 5"  (1.956 m)   Wt 228 lb (103.4 kg)   SpO2 97%   BMI 27.04 kg/m   Physical Exam BP 122/80 (BP Location: Right Arm, Patient Position: Sitting, Cuff Size: Large)   Pulse 92   Temp 98 F (36.7 C) (Oral)   Resp 18   Ht 6\' 5"  (1.956 m)   Wt 228 lb (103.4 kg)   SpO2 97%   BMI 27.04 kg/m   General Appearance:    Alert, cooperative, no distress, appears stated age  Head:    Normocephalic, without obvious abnormality, atraumatic  Eyes:    PERRL, conjunctiva/corneas clear, EOM's intact, fundi    benign, both eyes       Ears:    Normal TM's and external ear canals, both ears  Nose:   Nares normal, septum midline, mucosa normal, no drainage   or sinus tenderness  Throat:   Lips, mucosa, and tongue normal;  gums normal  Neck:   Supple, symmetrical, trachea midline, no adenopathy;       thyroid:  No enlargement/tenderness/nodules; no carotid   bruit   Back:     Symmetric, no curvature, ROM normal, no CVA tenderness  Lungs:     Clear to auscultation bilaterally, respirations unlabored  Chest wall:    No tenderness or deformity  Heart:    Regular rate and rhythm, S1 and S2 normal, no murmur, rub   or gallop  Abdomen:     Soft, non-tender, bowel sounds active all four quadrants,    no masses, no  organomegaly  Genitalia:    Normal male without lesion, discharge or tenderness  Extremities:   Extremities normal, atraumatic, no cyanosis or edema  Pulses:   2+ and symmetric all extremities  Skin:   Skin color, texture, turgor normal, no rashes or lesions  Lymph nodes:   Cervical, supraclavicular, and axillary nodes normal  Neurologic:   Normal strength, sensation and reflexes      throughout  ' ASSESSMENT/PLAN:  1. Physical exam, annual No focal neuro findings Due for colon cancer screen next year Needs to complete syncope work up   Patient Instructions  Need blood work today I will send you a letter with your test results.  If there is anything of concern, we will call right  away.  You need to call [facility] to set up an appointment Their number is : [phone]  [facility] [place] This is cardiology to check your heart Their number is:  [phone]  See me in 3 months Call sooner for problems   Eustace MooreYvonne Sue Zipporah Finamore, MD

## 2016-08-14 ENCOUNTER — Encounter: Payer: Self-pay | Admitting: Cardiology

## 2016-10-16 ENCOUNTER — Ambulatory Visit: Payer: BLUE CROSS/BLUE SHIELD | Admitting: Family Medicine

## 2017-11-28 ENCOUNTER — Encounter (HOSPITAL_COMMUNITY): Payer: Self-pay | Admitting: Emergency Medicine

## 2017-11-28 ENCOUNTER — Other Ambulatory Visit: Payer: Self-pay

## 2017-11-28 ENCOUNTER — Observation Stay (HOSPITAL_COMMUNITY)
Admission: EM | Admit: 2017-11-28 | Discharge: 2017-11-29 | Disposition: A | Discharge status: Home / Self Care | Payer: BLUE CROSS/BLUE SHIELD | Attending: Internal Medicine | Admitting: Internal Medicine

## 2017-11-28 ENCOUNTER — Emergency Department (HOSPITAL_COMMUNITY): Payer: BLUE CROSS/BLUE SHIELD

## 2017-11-28 DIAGNOSIS — Z79899 Other long term (current) drug therapy: Secondary | ICD-10-CM | POA: Insufficient documentation

## 2017-11-28 DIAGNOSIS — R112 Nausea with vomiting, unspecified: Secondary | ICD-10-CM | POA: Insufficient documentation

## 2017-11-28 DIAGNOSIS — R42 Dizziness and giddiness: Secondary | ICD-10-CM | POA: Diagnosis not present

## 2017-11-28 DIAGNOSIS — Z7982 Long term (current) use of aspirin: Secondary | ICD-10-CM | POA: Insufficient documentation

## 2017-11-28 DIAGNOSIS — R55 Syncope and collapse: Secondary | ICD-10-CM | POA: Diagnosis not present

## 2017-11-28 DIAGNOSIS — R269 Unspecified abnormalities of gait and mobility: Secondary | ICD-10-CM | POA: Diagnosis not present

## 2017-11-28 DIAGNOSIS — J3489 Other specified disorders of nose and nasal sinuses: Secondary | ICD-10-CM | POA: Diagnosis not present

## 2017-11-28 LAB — COMPREHENSIVE METABOLIC PANEL
ALT: 22 U/L (ref 17–63)
AST: 23 U/L (ref 15–41)
Albumin: 4.3 g/dL (ref 3.5–5.0)
Alkaline Phosphatase: 54 U/L (ref 38–126)
Anion gap: 7 (ref 5–15)
BUN: 18 mg/dL (ref 6–20)
CO2: 28 mmol/L (ref 22–32)
Calcium: 9.3 mg/dL (ref 8.9–10.3)
Chloride: 105 mmol/L (ref 101–111)
Creatinine, Ser: 1.11 mg/dL (ref 0.61–1.24)
GFR calc Af Amer: >60 mL/min (ref >60)
GFR calc non Af Amer: >60 mL/min (ref >60)
Glucose, Bld: 113 mg/dL — ABNORMAL HIGH (ref 65–99)
Potassium: 4.2 mmol/L (ref 3.5–5.1)
Sodium: 140 mmol/L (ref 135–145)
Total Bilirubin: 0.7 mg/dL (ref 0.3–1.2)
Total Protein: 7.1 g/dL (ref 6.5–8.1)

## 2017-11-28 LAB — CBC
HCT: 45.9 % (ref 39.0–52.0)
Hemoglobin: 15.5 g/dL (ref 13.0–17.0)
MCH: 30.6 pg (ref 26.0–34.0)
MCHC: 33.8 g/dL (ref 30.0–36.0)
MCV: 90.7 fL (ref 78.0–100.0)
Platelets: 238 10*3/uL (ref 150–400)
RBC: 5.06 MIL/uL (ref 4.22–5.81)
RDW: 13.4 % (ref 11.5–15.5)
WBC: 4.8 10*3/uL (ref 4.0–10.5)

## 2017-11-28 LAB — URINALYSIS, ROUTINE W REFLEX MICROSCOPIC
Bilirubin Urine: NEGATIVE
Glucose, UA: NEGATIVE mg/dL
Hgb urine dipstick: NEGATIVE
Ketones, ur: NEGATIVE mg/dL
Leukocytes, UA: NEGATIVE
Nitrite: NEGATIVE
Protein, ur: NEGATIVE mg/dL
Specific Gravity, Urine: 1.018 (ref 1.005–1.030)
pH: 7 (ref 5.0–8.0)

## 2017-11-28 LAB — PROTIME-INR
INR: 0.97
Prothrombin Time: 12.8 seconds (ref 11.4–15.2)

## 2017-11-28 LAB — DIFFERENTIAL
Basophils Absolute: 0 10*3/uL (ref 0.0–0.1)
Basophils Relative: 0 %
Eosinophils Absolute: 0.1 10*3/uL (ref 0.0–0.7)
Eosinophils Relative: 3 %
Lymphocytes Relative: 40 %
Lymphs Abs: 1.9 10*3/uL (ref 0.7–4.0)
Monocytes Absolute: 0.4 10*3/uL (ref 0.1–1.0)
Monocytes Relative: 7 %
Neutro Abs: 2.3 10*3/uL (ref 1.7–7.7)
Neutrophils Relative %: 50 %

## 2017-11-28 LAB — CBG MONITORING, ED: Glucose-Capillary: 137 mg/dL — ABNORMAL HIGH (ref 65–99)

## 2017-11-28 LAB — I-STAT TROPONIN, ED: Troponin i, poc: 0.01 ng/mL (ref 0.00–0.08)

## 2017-11-28 LAB — APTT: aPTT: 28 seconds (ref 24–36)

## 2017-11-28 MED ORDER — ASPIRIN EC 81 MG PO TBEC
81.0000 mg | DELAYED_RELEASE_TABLET | Freq: Every day | ORAL | Status: DC
  Administered 2017-11-29: 81 mg via ORAL
  Filled 2017-11-28: qty 1

## 2017-11-28 MED ORDER — ONDANSETRON HCL 4 MG/2ML IJ SOLN: 4.0000 mg | Freq: Four times a day (QID) | INTRAMUSCULAR | Status: DC | PRN

## 2017-11-28 MED ORDER — LORAZEPAM 2 MG/ML IJ SOLN
1.0000 mg | Freq: Once | INTRAMUSCULAR | Status: AC
  Administered 2017-11-28: 1 mg via INTRAVENOUS
  Filled 2017-11-28: qty 1

## 2017-11-28 MED ORDER — ONDANSETRON HCL 4 MG PO TABS: 4.0000 mg | ORAL_TABLET | Freq: Four times a day (QID) | ORAL | Status: DC | PRN

## 2017-11-28 MED ORDER — MECLIZINE HCL 12.5 MG PO TABS
25.0000 mg | ORAL_TABLET | Freq: Once | ORAL | Status: AC
  Administered 2017-11-28: 25 mg via ORAL
  Filled 2017-11-28: qty 2

## 2017-11-28 MED ORDER — ACETAMINOPHEN 650 MG RE SUPP: 650.0000 mg | Freq: Four times a day (QID) | RECTAL | Status: DC | PRN

## 2017-11-28 MED ORDER — SODIUM CHLORIDE 0.9 % IV BOLUS
500.0000 mL | Freq: Once | INTRAVENOUS | Status: AC
  Administered 2017-11-28: 500 mL via INTRAVENOUS

## 2017-11-28 MED ORDER — LORAZEPAM 2 MG/ML IJ SOLN: 0.5000 mg | Freq: Four times a day (QID) | INTRAMUSCULAR | Status: DC | PRN

## 2017-11-28 MED ORDER — ENOXAPARIN SODIUM 40 MG/0.4ML ~~LOC~~ SOLN
40.0000 mg | SUBCUTANEOUS | Status: DC
  Administered 2017-11-28: 40 mg via SUBCUTANEOUS
  Filled 2017-11-28: qty 0.4

## 2017-11-28 MED ORDER — LORAZEPAM 2 MG/ML IJ SOLN
0.5000 mg | Freq: Once | INTRAMUSCULAR | Status: AC
  Administered 2017-11-28: 0.5 mg via INTRAVENOUS
  Filled 2017-11-28: qty 1

## 2017-11-28 MED ORDER — PROMETHAZINE HCL 25 MG/ML IJ SOLN: 12.5000 mg | Freq: Four times a day (QID) | INTRAMUSCULAR | Status: DC | PRN

## 2017-11-28 MED ORDER — ONDANSETRON HCL 4 MG/2ML IJ SOLN
4.0000 mg | Freq: Once | INTRAMUSCULAR | Status: AC
  Administered 2017-11-28: 4 mg via INTRAVENOUS
  Filled 2017-11-28: qty 2

## 2017-11-28 MED ORDER — METOCLOPRAMIDE HCL 5 MG/ML IJ SOLN
10.0000 mg | Freq: Once | INTRAMUSCULAR | Status: AC
  Administered 2017-11-28: 10 mg via INTRAVENOUS
  Filled 2017-11-28: qty 2

## 2017-11-28 MED ORDER — SENNOSIDES-DOCUSATE SODIUM 8.6-50 MG PO TABS: 1.0000 | ORAL_TABLET | Freq: Every evening | ORAL | Status: DC | PRN

## 2017-11-28 MED ORDER — SODIUM CHLORIDE 0.9 % IV SOLN
INTRAVENOUS | Status: AC
  Administered 2017-11-28: 21:00:00 via INTRAVENOUS

## 2017-11-28 MED ORDER — PROCHLORPERAZINE EDISYLATE 10 MG/2ML IJ SOLN
10.0000 mg | Freq: Four times a day (QID) | INTRAMUSCULAR | Status: DC | PRN
Start: 2017-11-28 — End: 2017-11-29
  Administered 2017-11-28: 10 mg via INTRAVENOUS
  Filled 2017-11-28: qty 2

## 2017-11-28 MED ORDER — SODIUM CHLORIDE 0.9% FLUSH
3.0000 mL | Freq: Two times a day (BID) | INTRAVENOUS | Status: DC
  Administered 2017-11-28 – 2017-11-29 (×2): 3 mL via INTRAVENOUS

## 2017-11-28 MED ORDER — ACETAMINOPHEN 325 MG PO TABS: 650.0000 mg | ORAL_TABLET | Freq: Four times a day (QID) | ORAL | Status: DC | PRN

## 2017-11-28 MED ORDER — MECLIZINE HCL 12.5 MG PO TABS: 25.0000 mg | ORAL_TABLET | Freq: Three times a day (TID) | ORAL | Status: DC | PRN

## 2017-11-28 MED ORDER — ADULT MULTIVITAMIN W/MINERALS CH
1.0000 | ORAL_TABLET | Freq: Every day | ORAL | Status: DC
  Administered 2017-11-29: 1 via ORAL
  Filled 2017-11-28: qty 1

## 2017-11-28 NOTE — ED Notes (Signed)
Pt says he vomit when he stands or raise up.

## 2017-11-28 NOTE — ED Notes (Addendum)
Pt not able to do MRI.  Not able to go into machine, even after given Ativan.

## 2017-11-28 NOTE — ED Notes (Signed)
Patient unable to stand to complete ortho vital signs.  Patient became very dizzy and was unable to stand.

## 2017-11-28 NOTE — ED Provider Notes (Signed)
[facility] EMERGENCY DEPARTMENT Provider Note   CSN: [id number] Arrival date & time: 11/28/17  1417     History   Chief Complaint Chief Complaint  Patient presents with  . Near Syncope    HPI Charles Campbell is a 57 y.o. male.  HPI Patient states that 1 hour prior to presentation he had sudden onset of spinning sensation.  Associated with nausea and one episode of vomiting.  Worse with movement.  Patient has been able to ambulate but felt significantly off balance.  Denied any focal weakness or numbness.  No recent head injury.  Patient does have history of "allergies" for which she is taking multiple medications. Past Medical History:  Diagnosis Date  . Arthritis    Wrist  . H/O hiatal hernia    watches diet  . Hyperlipidemia     Patient Active Problem List   Diagnosis Date Noted  . Vertigo 11/28/2017  . Intractable nausea and vomiting 11/28/2017  . Syncope and collapse 07/02/2016  . Environmental allergies 07/02/2016    Past Surgical History:  Procedure Laterality Date  . COLONOSCOPY  2009  . FINGER SURGERY     Left index- due to injury  . MULTIPLE EXTRACTIONS WITH ALVEOLOPLASTY  12/24/2011   Procedure: MULTIPLE EXTRACION WITH ALVEOLOPLASTY;  Surgeon: [name], DDS;  Location: [facility] OR;  Service: Oral Surgery;  Laterality: Bilateral;        Home Medications    Prior to Admission medications   Medication Sig Start Date End Date Taking? Authorizing Provider  aspirin EC 81 MG tablet Take 81 mg by mouth daily.    [provider]  Multiple Vitamins-Minerals (CENTRUM SILVER ULTRA MENS) TABS Take 1 tablet by mouth daily.      [provider]  vitamin C (ASCORBIC ACID) 500 MG tablet Take 500 mg by mouth daily.    [provider]    Family History Family History  Problem Relation Age of Onset  . Cancer Mother        lung  . Asthma Mother   . Heart disease Father 65       heart attack  . Cancer Brother        brain  . Cancer  Brother     Social History Social History   Tobacco Use  . Smoking status: Never Smoker  . Smokeless tobacco: Never Used  Substance Use Topics  . Alcohol use: No  . Drug use: No     Allergies   Patient has no known allergies.   Review of Systems Review of Systems  Constitutional: Negative for chills and fever.  HENT: Positive for rhinorrhea and sinus pressure. Negative for hearing loss.   Eyes: Negative for visual disturbance.  Respiratory: Negative for shortness of breath and wheezing.   Cardiovascular: Negative for chest pain.  Gastrointestinal: Positive for nausea and vomiting. Negative for constipation and diarrhea.  Musculoskeletal: Positive for gait problem. Negative for back pain and neck pain.  Neurological: Positive for dizziness. Negative for speech difficulty, weakness, light-headedness, numbness and headaches.  All other systems reviewed and are negative.    Physical Exam Updated Vital Signs BP (!) 142/82   Pulse 80   Temp 97.6 F (36.4 C)   Resp 16   Ht 6\' 5"  (1.956 m)   Wt 100.7 kg (222 lb)   SpO2 98%   BMI 26.33 kg/m   Physical Exam  Constitutional: He is oriented to person, place, and time. He appears well-developed and well-nourished.  No distress.  HENT:  Head: Normocephalic and atraumatic.  Mouth/Throat: Oropharynx is clear and moist.  Eyes: Pupils are equal, round, and reactive to light. EOM are normal.  Fatigable horizontal nystagmus  Neck: Normal range of motion. Neck supple.  Cardiovascular: Normal rate and regular rhythm. Exam reveals no gallop and no friction rub.  No murmur heard. Pulmonary/Chest: Effort normal and breath sounds normal.  Abdominal: Soft. Bowel sounds are normal. There is no tenderness. There is no rebound and no guarding.  Musculoskeletal: Normal range of motion. He exhibits no edema or tenderness.  Lymphadenopathy:    He has no cervical adenopathy.  Neurological: He is alert and oriented to person, place, and  time.  Patient is alert and oriented x3 with clear, goal oriented speech. Patient has 5/5 motor in all extremities. Sensation is intact to light touch. Bilateral finger-to-nose is normal with no signs of dysmetria.  Skin: Skin is warm and dry. Capillary refill takes less than 2 seconds. No rash noted. He is not diaphoretic. No erythema.  Psychiatric: He has a normal mood and affect. His behavior is normal.  Nursing note and vitals reviewed.    ED Treatments / Results  Labs (all labs ordered are listed, but only abnormal results are displayed) Labs Reviewed  COMPREHENSIVE METABOLIC PANEL - Abnormal; Notable for the following components:      Result Value   Glucose, Bld 113 (*)    All other components within normal limits  CBG MONITORING, ED - Abnormal; Notable for the following components:   Glucose-Capillary 137 (*)    All other components within normal limits  CBC  URINALYSIS, ROUTINE W REFLEX MICROSCOPIC  PROTIME-INR  APTT  DIFFERENTIAL  I-STAT TROPONIN, ED    EKG EKG Interpretation  Date/Time:  Thursday November 28 2017 14:33:05 EDT Ventricular Rate:  63 PR Interval:    QRS Duration: 103 QT Interval:  408 QTC Calculation: 418 R Axis:   68 Text Interpretation:  Sinus rhythm RSR' in V1 or V2, right VCD or RVH Confirmed by Loren RacerYelverton, Joan Herschberger ([id number]) on 11/28/2017 3:22:50 PM   Radiology Mr Brain Limited Wo Contrast  Result Date: 11/28/2017 CLINICAL DATA:  Dizziness, nausea, vomiting and near syncope today. EXAM: MRI HEAD WITHOUT CONTRAST TECHNIQUE: Multiplanar, multiecho pulse sequences of the brain and surrounding structures were obtained without intravenous contrast. COMPARISON:  CT 06/27/2016 FINDINGS: Brain: Only diffusion imaging could be achieved due to patient intolerance. Diffusion imaging does not show any acute or subacute infarction. No T2 brain lesions are seen. There is chronic ventriculomegaly, similar to the CT of last year. This could be chronic and compensated or  there could be communicating hydrocephalus. No evidence of mass lesion. No extra-axial collection. Vascular: Unable to evaluate. Skull and upper cervical spine: No abnormality seen. Sinuses/Orbits: No sinus disease orbit pathology seen. Other: None IMPRESSION: Diffusion only exam due to patient tolerance. No acute infarction or other cause of restricted diffusion. Chronic ventriculomegaly, similar to the CT study of last year. Communicating hydrocephalus not excluded. Electronically Signed   By: Paulina FusiMark  Shogry M.D.   On: 11/28/2017 18:43    Procedures Procedures (including critical care time)  Medications Ordered in ED Medications  LORazepam (ATIVAN) injection 0.5 mg (0.5 mg Intravenous Given 11/28/17 1449)  ondansetron (ZOFRAN) injection 4 mg (4 mg Intravenous Given 11/28/17 1449)  sodium chloride 0.9 % bolus 500 mL (0 mLs Intravenous Stopped 11/28/17 1526)  LORazepam (ATIVAN) injection 1 mg (1 mg Intravenous Given 11/28/17 1600)  ondansetron (ZOFRAN) injection 4 mg (  4 mg Intravenous Given 11/28/17 1650)  sodium chloride 0.9 % bolus 500 mL (0 mLs Intravenous Stopped 11/28/17 1720)  LORazepam (ATIVAN) injection 1 mg (1 mg Intravenous Given 11/28/17 1805)  meclizine (ANTIVERT) tablet 25 mg (25 mg Oral Given 11/28/17 1900)  metoCLOPramide (REGLAN) injection 10 mg (10 mg Intravenous Given 11/28/17 2003)     Initial Impression / Assessment and Plan / ED Course  I have reviewed the triage vital signs and the nursing notes.  Pertinent labs & imaging results that were available during my care of the patient were reviewed by me and considered in my medical decision making (see chart for details).     Patient was unable to tolerate MRI due to anxiety and claustrophobia.  We will re-medicate and attempt MRI again. Unable to complete entire MRI.  Patient did have some of it performed and images were sent to radiology.  No obvious stroke.  Dilated ventricles similar to previous CT without acute  changes.  Patient required multiple doses of Ativan, meclizine, antiemetics and still is vomiting and very dizzy with position change.  Have discussed with hospitalist who will see patient in the emergency department and admit. Final Clinical Impressions(s) / ED Diagnoses   Final diagnoses:  Vertigo  Intractable vomiting with nausea, unspecified vomiting type    ED Discharge Orders    None       Loren Racer, MD 11/28/17 2016

## 2017-11-28 NOTE — H&P (Signed)
History and Physical    Charles ScottDwight W Plouff ONG:[id number] DOB: 12/16/1960 DOA: 11/28/2017  PCP: Eustace MooreNelson, Yvonne Sue, MD   Patient coming from: Home  Chief Complaint: Dizziness, N/V   HPI: Charles Campbell is a 57 y.o. male who denies any significant past medical history, presenting to the emergency department with acute onset of dizziness, nausea, and nonbloody vomiting.  Patient had been in his usual state of health until approximately 1 hour prior to presentation when he developed sudden onset of a sensation as though the room was spinning.  There was a nausea associated with this and he reports nonbloody vomiting.  Symptoms have persisted, are worse with movement, and have made it difficult to ambulate.  Denies recent fall or trauma and denies any focal numbness or weakness. He did fall in January, striking the back of his head and losing consciousness; was evaluated in ED at that time with CT head negative for acute finding and recovered without incident.   ED Course: Upon arrival to the ED, patient is found to be afebrile, saturating well on room air, and with vitals otherwise stable.  EKG features a sinus rhythm.  Chemistry panel and CBC are unremarkable, troponin is normal, and urinalysis unremarkable.  MRI brain was performed, a limited study, and is negative for acute findings.  Patient was treated with multiple doses of Ativan, meclizine, Reglan, and Zofran in the ED.  He was given a liter of normal saline.  He continues to have nausea with vomiting and is unable to tolerate anything by mouth.  He will be observed for ongoing evaluation and management of vertigo, likely peripheral.  Review of Systems:  All other systems reviewed and apart from HPI, are negative.  Past Medical History:  Diagnosis Date  . Arthritis    Wrist  . H/O hiatal hernia    watches diet  . Hyperlipidemia     Past Surgical History:  Procedure Laterality Date  . COLONOSCOPY  2009  . FINGER SURGERY     Left index-  due to injury  . MULTIPLE EXTRACTIONS WITH ALVEOLOPLASTY  12/24/2011   Procedure: MULTIPLE EXTRACION WITH ALVEOLOPLASTY;  Surgeon: Georgia LopesScott M Jensen, DDS;  Location: [facility] OR;  Service: Oral Surgery;  Laterality: Bilateral;     reports that he has never smoked. He has never used smokeless tobacco. He reports that he does not drink alcohol or use drugs.  No Known Allergies  Family History  Problem Relation Age of Onset  . Cancer Mother        lung  . Asthma Mother   . Heart disease Father [id number]       heart attack  . Cancer Brother        brain  . Cancer Brother      Prior to Admission medications   Medication Sig Start Date End Date Taking? Authorizing Provider  aspirin EC 81 MG tablet Take 81 mg by mouth daily.    [provider]  Multiple Vitamins-Minerals (CENTRUM SILVER ULTRA MENS) TABS Take 1 tablet by mouth daily.      [provider]  vitamin C (ASCORBIC ACID) 500 MG tablet Take 500 mg by mouth daily.    [provider]    Physical Exam: Vitals:   11/28/17 1730 11/28/17 1745 11/28/17 1800 11/28/17 1902  BP: 120/85 121/83 119/81 (!) 142/82  Pulse: 70 73 64 80  Resp: 15 15 14 16   Temp:      TempSrc:      SpO2:  98% 99% 97% 98%  Weight:      Height:          Constitutional: NAD, appears uncomfortable Eyes: PERTLA, lids and conjunctivae normal ENMT: Mucous membranes are moist. Posterior pharynx clear of any exudate or lesions.   Neck: normal, supple, no masses, no thyromegaly Respiratory: clear to auscultation bilaterally, no wheezing, no crackles. Normal respiratory effort.   Cardiovascular: S1 & S2 heard, regular rate and rhythm. No extremity edema. No significant JVD. Abdomen: No distension, no tenderness, soft. Bowel sounds normal.  Musculoskeletal: no clubbing / cyanosis. No joint deformity upper and lower extremities.    Skin: no significant rashes, lesions, ulcers. Warm, dry, well-perfused. Neurologic: Nystagmus. CN 2-12 grossly intact  otherwise. Sensation to light touch intact, patellar DTR normal. Strength 5/5 in all 4 limbs.  Psychiatric: Alert and oriented x 3. Calm, cooperative.     Labs on Admission: I have personally reviewed following labs and imaging studies  CBC: Recent Labs  Lab 11/28/17 1428  WBC 4.8  NEUTROABS 2.3  HGB 15.5  HCT 45.9  MCV 90.7  PLT 238   Basic Metabolic Panel: Recent Labs  Lab 11/28/17 1455  NA 140  K 4.2  CL 105  CO2 28  GLUCOSE 113*  BUN 18  CREATININE 1.11  CALCIUM 9.3   GFR: Estimated Creatinine Clearance: 91.4 mL/min (by C-G formula based on SCr of 1.11 mg/dL). Liver Function Tests: Recent Labs  Lab 11/28/17 1455  AST 23  ALT 22  ALKPHOS 54  BILITOT 0.7  PROT 7.1  ALBUMIN 4.3   No results for input(s): LIPASE, AMYLASE in the last 168 hours. No results for input(s): AMMONIA in the last 168 hours. Coagulation Profile: Recent Labs  Lab 11/28/17 1428  INR 0.97   Cardiac Enzymes: No results for input(s): CKTOTAL, CKMB, CKMBINDEX, TROPONINI in the last 168 hours. BNP (last 3 results) No results for input(s): PROBNP in the last 8760 hours. HbA1C: No results for input(s): HGBA1C in the last 72 hours. CBG: Recent Labs  Lab 11/28/17 1459  GLUCAP 137*   Lipid Profile: No results for input(s): CHOL, HDL, LDLCALC, TRIG, CHOLHDL, LDLDIRECT in the last 72 hours. Thyroid Function Tests: No results for input(s): TSH, T4TOTAL, FREET4, T3FREE, THYROIDAB in the last 72 hours. Anemia Panel: No results for input(s): VITAMINB12, FOLATE, FERRITIN, TIBC, IRON, RETICCTPCT in the last 72 hours. Urine analysis:    Component Value Date/Time   COLORURINE YELLOW 11/28/2017 1605   APPEARANCEUR CLEAR 11/28/2017 1605   LABSPEC 1.018 11/28/2017 1605   PHURINE 7.0 11/28/2017 1605   GLUCOSEU NEGATIVE 11/28/2017 1605   HGBUR NEGATIVE 11/28/2017 1605   BILIRUBINUR NEGATIVE 11/28/2017 1605   KETONESUR NEGATIVE 11/28/2017 1605   PROTEINUR NEGATIVE 11/28/2017 1605   NITRITE  NEGATIVE 11/28/2017 1605   LEUKOCYTESUR NEGATIVE 11/28/2017 1605   Sepsis Labs: @LABRCNTIP (procalcitonin:4,lacticidven:4) )No results found for this or any previous visit (from the past 240 hour(s)).   Radiological Exams on Admission: Mr Brain Limited Wo Contrast  Result Date: 11/28/2017 CLINICAL DATA:  Dizziness, nausea, vomiting and near syncope today. EXAM: MRI HEAD WITHOUT CONTRAST TECHNIQUE: Multiplanar, multiecho pulse sequences of the brain and surrounding structures were obtained without intravenous contrast. COMPARISON:  CT 06/27/2016 FINDINGS: Brain: Only diffusion imaging could be achieved due to patient intolerance. Diffusion imaging does not show any acute or subacute infarction. No T2 brain lesions are seen. There is chronic ventriculomegaly, similar to the CT of last year. This could be chronic and compensated or there could be  communicating hydrocephalus. No evidence of mass lesion. No extra-axial collection. Vascular: Unable to evaluate. Skull and upper cervical spine: No abnormality seen. Sinuses/Orbits: No sinus disease orbit pathology seen. Other: None IMPRESSION: Diffusion only exam due to patient tolerance. No acute infarction or other cause of restricted diffusion. Chronic ventriculomegaly, similar to the CT study of last year. Communicating hydrocephalus not excluded. Electronically Signed   By: Paulina Fusi M.D.   On: 11/28/2017 18:43    EKG: Independently reviewed. Sinus rhythm.   Assessment/Plan   1. Intractable nausea with vomiting; vertigo   - Presents with acute onset of vertiginous symptoms with nausea and recurrent vomiting  - No focal deficits identified and limited MRI brain is negative for acute infarct  - Continues to experience N/V and is unable to tolerate PO despite treatment in ED with meclizine, Zofran x2, Ativan x3, and Reglan  - Continue supportive care, OT eval and tx, MRA head and neck     DVT prophylaxis: Lovenox  Code Status: Full  Family  Communication: Discussed with patient  Consults called: None Admission status: Observation     Briscoe Deutscher, MD [facility] Pager [phone]  If 7PM-7AM, please contact night-coverage [email or web address] Password [facility]  11/28/2017, 8:27 PM

## 2017-11-28 NOTE — ED Notes (Signed)
Per MD request, RN attempted to ambulate Pt. Pt stood up after sitting on bedside, and immediately felt nauseated and vomited. RN placed Pt back in bed. Pt unable to continue attempts to ambulate at this time. MD notified.

## 2017-11-28 NOTE — ED Triage Notes (Addendum)
Patient states sitting at house when he became dizzy suddenly and felt like he was "going to pass out." Patient states nausea and, off balance. Per patient feels like room is spinning constantly. Denies any slurred speech, facial drooping, headache, or weakness. Per family member blood sugar checked at home 140s.

## 2017-11-28 NOTE — ED Notes (Signed)
Pt vomited after oral intake.  Passed swallow study.  Dr Ranae PalmsYelverton informed.  See new orders.

## 2017-11-28 NOTE — ED Notes (Signed)
Cardiac leads removed for MRI

## 2017-11-29 ENCOUNTER — Encounter (HOSPITAL_COMMUNITY): Payer: Self-pay

## 2017-11-29 DIAGNOSIS — R112 Nausea with vomiting, unspecified: Secondary | ICD-10-CM

## 2017-11-29 DIAGNOSIS — R42 Dizziness and giddiness: Secondary | ICD-10-CM | POA: Diagnosis not present

## 2017-11-29 LAB — CBC
HCT: 43.9 % (ref 39.0–52.0)
Hemoglobin: 14.4 g/dL (ref 13.0–17.0)
MCH: 30 pg (ref 26.0–34.0)
MCHC: 32.8 g/dL (ref 30.0–36.0)
MCV: 91.5 fL (ref 78.0–100.0)
Platelets: 204 10*3/uL (ref 150–400)
RBC: 4.8 MIL/uL (ref 4.22–5.81)
RDW: 13.3 % (ref 11.5–15.5)
WBC: 5.5 10*3/uL (ref 4.0–10.5)

## 2017-11-29 LAB — BASIC METABOLIC PANEL
Anion gap: 7 (ref 5–15)
BUN: 14 mg/dL (ref 6–20)
CO2: 25 mmol/L (ref 22–32)
Calcium: 8.3 mg/dL — ABNORMAL LOW (ref 8.9–10.3)
Chloride: 108 mmol/L (ref 101–111)
Creatinine, Ser: 0.84 mg/dL (ref 0.61–1.24)
GFR calc Af Amer: >60 mL/min (ref >60)
GFR calc non Af Amer: >60 mL/min (ref >60)
Glucose, Bld: 92 mg/dL (ref 65–99)
Potassium: 3.6 mmol/L (ref 3.5–5.1)
Sodium: 140 mmol/L (ref 135–145)

## 2017-11-29 LAB — GLUCOSE, CAPILLARY: Glucose-Capillary: 86 mg/dL (ref 65–99)

## 2017-11-29 MED ORDER — ONDANSETRON HCL 4 MG PO TABS: 4.0000 mg | ORAL_TABLET | Freq: Four times a day (QID) | ORAL | 0 refills | Status: DC | PRN

## 2017-11-29 MED ORDER — MECLIZINE HCL 25 MG PO TABS: 25.0000 mg | ORAL_TABLET | Freq: Three times a day (TID) | ORAL | 0 refills | Status: DC | PRN

## 2017-11-29 NOTE — Plan of Care (Signed)
progressing 

## 2017-11-29 NOTE — Evaluation (Signed)
Physical Therapy Evaluation Patient Details Name: Charles Campbell Zuidema MRN: [id number] DOB: 09/12/1960 Today's Date: 11/29/2017   History of Present Illness  Charles Campbell Ficek is a 57 y.o. male who denies any significant past medical history, presenting to the emergency department with acute onset of dizziness, nausea, and nonbloody vomiting.  Patient had been in his usual state of health until approximately 1 hour prior to presentation when he developed sudden onset of a sensation as though the room was spinning.  There was a nausea associated with this and he reports nonbloody vomiting.  Symptoms have persisted, are worse with movement, and have made it difficult to ambulate.  Denies recent fall or trauma and denies any focal numbness or weakness. He did fall in January 2018, striking the back of his head and losing consciousness; was evaluated in ED at that time with CT head negative for acute finding and recovered without incident.     Clinical Impression  Patient functioning at baseline for functional mobility and gait.  Patient informed that he can follow up for a vestibular evaluation in an out patient or home health setting after being off dizziness medications for at least 4 days.  Plan:  Patient discharged from physical therapy to care of nursing for ambulation daily as tolerated for length of stay.    Follow Up Recommendations Outpatient PT;Home health PT(for vestibular evaluation after off dizziness medications for at least 4 days)    Equipment Recommendations  None recommended by PT    Recommendations for Other Services       Precautions / Restrictions Precautions Precautions: None Restrictions Weight Bearing Restrictions: No      Mobility  Bed Mobility Overal bed mobility: Independent                Transfers Overall transfer level: Independent                  Ambulation/Gait Ambulation/Gait assistance: Modified independent (Device/Increase time) Gait Distance  (Feet): 200 Feet Assistive device: None Gait Pattern/deviations: WFL(Within Functional Limits) Gait velocity: decreased   General Gait Details: grossly WFL except slightly slower than normal cadence, no loss of balance on level, inclined and declined surfaces  Stairs            Wheelchair Mobility    Modified Rankin (Stroke Patients Only)       Balance Overall balance assessment: No apparent balance deficits (not formally assessed)                                           Pertinent Vitals/Pain Pain Assessment: No/denies pain    Home Living Family/patient expects to be discharged to:: Private residence Living Arrangements: Alone Available Help at Discharge: Family;Friend(s);Available PRN/intermittently Type of Home: House Home Access: Level entry     Home Layout: Two level;Able to live on main level with bedroom/bathroom Home Equipment: None      Prior Function Level of Independence: Independent         Comments: pt independent and working full time     Hand Dominance   Dominant Hand: Right    Extremity/Trunk Assessment   Upper Extremity Assessment Upper Extremity Assessment: Defer to OT evaluation    Lower Extremity Assessment Lower Extremity Assessment: Overall WFL for tasks assessed    Cervical / Trunk Assessment Cervical / Trunk Assessment: Normal  Communication   Communication: No difficulties  Cognition Arousal/Alertness: Awake/alert Behavior During Therapy: WFL for tasks assessed/performed Overall Cognitive Status: Within Functional Limits for tasks assessed                                        General Comments      Exercises     Assessment/Plan    PT Assessment Patent does not need any further PT services  PT Problem List         PT Treatment Interventions      PT Goals (Current goals can be found in the Care Plan section)  Acute Rehab PT Goals Patient Stated Goal: return home PT  Goal Formulation: With patient/family Time For Goal Achievement: 12/06/17 Potential to Achieve Goals: Good    Frequency     Barriers to discharge        Co-evaluation               AM-PAC PT "6 Clicks" Daily Activity  Outcome Measure Difficulty turning over in bed (including adjusting bedclothes, sheets and blankets)?: None Difficulty moving from lying on back to sitting on the side of the bed? : None Difficulty sitting down on and standing up from a chair with arms (e.g., wheelchair, bedside commode, etc,.)?: None Help needed moving to and from a bed to chair (including a wheelchair)?: None Help needed walking in hospital room?: None Help needed climbing 3-5 steps with a railing? : None 6 Click Score: 24    End of Session   Activity Tolerance: Patient tolerated treatment well Patient left: in chair;with call bell/phone within reach;with family/visitor present Nurse Communication: Mobility status PT Visit Diagnosis: Unsteadiness on feet (R26.81);Other abnormalities of gait and mobility (R26.89);Muscle weakness (generalized) (M62.81)    Time: 1610-9604 PT Time Calculation (min) (ACUTE ONLY): 21 min   Charges:     PT Treatments $Gait Training: 8-22 mins   PT G Codes:        12:01 PM, 12-06-2017 Ocie Bob, MPT Physical Therapist with [facility] [phone] office [phone] mobile phone

## 2017-11-29 NOTE — Care Management Note (Signed)
Case Management Note  Patient Details  Name: Charles Campbell MRN: [id number] Date of Birth: 01/17/1961  Subjective/Objective:         Admitted with vertigo.            Action/Plan: Pt dc home today with self care. Pt referred for OP PT for vestibular rehab. Referral sent to [facility] OP rehab per pt choice of facilities.   Expected Discharge Date:  11/29/17               Expected Discharge Plan:  Home/Self Care  In-House Referral:  NA  Discharge planning Services  CM Consult  Post Acute Care Choice:  NA Choice offered to:  NA  Status of Service:  Completed, signed off  If discussed at Long Length of Stay Meetings, dates discussed:    Additional Comments:  [name], [name], RN 11/29/2017, 2:10 PM

## 2017-11-29 NOTE — Progress Notes (Signed)
Patient reports that he has not had any N/V since 2300 yesterday.

## 2017-11-29 NOTE — Evaluation (Signed)
Occupational Therapy Evaluation Patient Details Name: Charles Campbell MRN: [id number] DOB: [birth year] Today's Date: 11/29/2017    History of Present Illness Charles Campbell is a 57 y.o. male who denies any significant past medical history, presenting to the emergency department with acute onset of dizziness, nausea, and nonbloody vomiting.  Patient had been in his usual state of health until approximately 1 hour prior to presentation when he developed sudden onset of a sensation as though the room was spinning.  There was a nausea associated with this and he reports nonbloody vomiting.  Symptoms have persisted, are worse with movement, and have made it difficult to ambulate.  Denies recent fall or trauma and denies any focal numbness or weakness. He did fall in January 2018, striking the back of his head and losing consciousness; was evaluated in ED at that time with CT head negative for acute finding and recovered without incident.    Clinical Impression   Pt received supine in bed, agreeable to OT evaluation. Pt reporting dizziness and nausea has resolved, pt feeling tired this am. Pt demonstrating ADL completion and functional mobility tasks at baseline independence including managing IV pole during task completion. No further OT services required at this time.     Follow Up Recommendations  No OT follow up    Equipment Recommendations  None recommended by OT       Precautions / Restrictions Precautions Precautions: None Restrictions Weight Bearing Restrictions: No      Mobility Bed Mobility Overal bed mobility: Modified Independent                Transfers Overall transfer level: Modified independent Equipment used: None                      ADL either performed or assessed with clinical judgement   ADL Overall ADL's : Modified independent;At baseline     Grooming: Wash/dry hands;Modified independent;Standing               Lower Body Dressing: Modified  independent;Sitting/lateral leans;Sit to/from stand   Toilet Transfer: Modified Independent;Ambulation;Regular Toilet   Toileting- Clothing Manipulation and Hygiene: Modified independent;Sit to/from stand       Functional mobility during ADLs: Supervision/safety       Vision Baseline Vision/History: No visual deficits Patient Visual Report: No change from baseline Vision Assessment?: No apparent visual deficits            Pertinent Vitals/Pain Pain Assessment: No/denies pain     Hand Dominance Right   Extremity/Trunk Assessment Upper Extremity Assessment Upper Extremity Assessment: Overall WFL for tasks assessed   Lower Extremity Assessment Lower Extremity Assessment: Overall WFL for tasks assessed   Cervical / Trunk Assessment Cervical / Trunk Assessment: Normal   Communication Communication Communication: No difficulties   Cognition Arousal/Alertness: Awake/alert Behavior During Therapy: WFL for tasks assessed/performed Overall Cognitive Status: Within Functional Limits for tasks assessed                                                Home Living Family/patient expects to be discharged to:: Private residence Living Arrangements: Alone Available Help at Discharge: Family;Friend(s);Available PRN/intermittently Type of Home: House Home Access: Level entry     Home Layout: Two level;Able to live on main level with bedroom/bathroom(pt stays on main level)     Bathroom Shower/Tub:  Tub/shower unit   Bathroom Toilet: Standard     Home Equipment: None          Prior Functioning/Environment Level of Independence: Independent        Comments: pt independent and working full time        OT Problem List: Decreased activity tolerance       AM-PAC PT "6 Clicks" Daily Activity     Outcome Measure Help from another person eating meals?: None Help from another person taking care of personal grooming?: None Help from another person  toileting, which includes using toliet, bedpan, or urinal?: None Help from another person bathing (including washing, rinsing, drying)?: None Help from another person to put on and taking off regular upper body clothing?: None Help from another person to put on and taking off regular lower body clothing?: None 6 Click Score: 24   End of Session Equipment Utilized During Treatment: Gait belt Nurse Communication: Mobility status;Other (comment)(pt up in chair, no chair alarm)  Activity Tolerance: Patient tolerated treatment well Patient left: in chair;with call bell/phone within reach  OT Visit Diagnosis: Muscle weakness (generalized) (M62.81);Other (comment)(dizziness)                Time: 0822-0840 OT Time [id number] (min): 18 min Charges:  OT General Charges $OT Visit: 1 Visit OT Evaluation $OT Eval Low Complexity: 1 Low   Ezra SitesLeslie Maxx Pham, OTR/L  [phone] 11/29/2017, 8:45 AM

## 2017-11-29 NOTE — Discharge Summary (Signed)
Physician Discharge Summary  Charles Campbell:[id number] DOB: [birth year] DOA: 11/28/2017  PCP: Eustace Moore, MD  Admit date: 11/28/2017 Discharge date: 11/29/2017  Admitted From: home Disposition:  home  Recommendations for Outpatient Follow-up:  1. Follow up with PCP in 1-2 weeks 2. Please obtain BMP/CBC in one week  Home Health:  Equipment/Devices:  Discharge Condition: stable CODE STATUS:full code Diet recommendation: regular diet  Brief/Interim Summary: 57 year old male presents to the hospital with complaints of acute onset of dizziness, nausea and vomiting.  Patient does not report any history of the same.  He reported that symptoms were worse when standing up.  Work-up in the emergency room included MRI brain which could not be completed due to patient's claustrophobia.  Initial images did not show any acute findings.  Clinically, patient did not have any acute neurologic deficits.  The following day, his neurologic exam was completely normal.  He had no cerebellar signs.  The patient was able to ambulate with physical therapy without any difficulty.  His nausea and vomiting had resolved and he was tolerating solid diet.  He did report some improvement of symptoms after receiving several medications for vertigo in the emergency room including antiemetics and antihistamines.  Imaging did indicate some ventriculomegaly which is a chronic finding.  He denied any chronic issues with urinary incontinence, ataxia.  Family did not report any signs of confusion or early dementia.  I suspect that his symptoms may be related to peripheral vertigo.  He will be discharged with some PRN meclizine as well as Zofran.  He is feeling back to normal and is requesting discharge home.  Discharge Diagnoses:  Principal Problem:   Intractable nausea and vomiting Active Problems:   Vertigo    Discharge Instructions  Discharge Instructions    Ambulatory referral to Physical Therapy   Complete by:   As directed    Diet - low sodium heart healthy   Complete by:  As directed    Increase activity slowly   Complete by:  As directed      Allergies as of 11/29/2017   No Known Allergies     Medication List    TAKE these medications   aspirin EC 81 MG tablet Take 81 mg by mouth daily.   CENTRUM SILVER ULTRA MENS Tabs Take 1 tablet by mouth daily.   cetirizine 5 MG tablet Commonly known as:  ZYRTEC Take 5 mg by mouth daily.   meclizine 25 MG tablet Commonly known as:  ANTIVERT Take 1 tablet (25 mg total) by mouth 3 (three) times daily as needed for dizziness.   ondansetron 4 MG tablet Commonly known as:  ZOFRAN Take 1 tablet (4 mg total) by mouth every 6 (six) hours as needed for nausea.   vitamin C 500 MG tablet Commonly known as:  ASCORBIC ACID Take 500 mg by mouth daily.       No Known Allergies  Consultations:     Procedures/Studies: Mr Brain Limited Wo Contrast  Result Date: 11/28/2017 CLINICAL DATA:  Dizziness, nausea, vomiting and near syncope today. EXAM: MRI HEAD WITHOUT CONTRAST TECHNIQUE: Multiplanar, multiecho pulse sequences of the brain and surrounding structures were obtained without intravenous contrast. COMPARISON:  CT 06/27/2016 FINDINGS: Brain: Only diffusion imaging could be achieved due to patient intolerance. Diffusion imaging does not show any acute or subacute infarction. No T2 brain lesions are seen. There is chronic ventriculomegaly, similar to the CT of last year. This could be chronic and compensated or there could be  communicating hydrocephalus. No evidence of mass lesion. No extra-axial collection. Vascular: Unable to evaluate. Skull and upper cervical spine: No abnormality seen. Sinuses/Orbits: No sinus disease orbit pathology seen. Other: None IMPRESSION: Diffusion only exam due to patient tolerance. No acute infarction or other cause of restricted diffusion. Chronic ventriculomegaly, similar to the CT study of last year. Communicating  hydrocephalus not excluded. Electronically Signed   By: Paulina FusiMark  Shogry M.D.   On: 11/28/2017 18:43       Subjective: Feels better.  No nausea or vomiting.  No dizziness.  Feels back to normal.  Discharge Exam: Vitals:   11/28/17 2115 11/29/17 0621  BP: 132/60 108/72  Pulse: 68 62  Resp: 18 15  Temp: 97.8 F (36.6 C) 97.6 F (36.4 C)  SpO2: 98% 95%   Vitals:   11/28/17 1902 11/28/17 2044 11/28/17 2115 11/29/17 0621  BP: (!) 142/82 (!) 134/98 132/60 108/72  Pulse: 80 69 68 62  Resp: 16 11 18 15   Temp:   97.8 F (36.6 C) 97.6 F (36.4 C)  TempSrc:   Oral Oral  SpO2: 98% 98% 98% 95%  Weight:   105.7 kg (233 lb 0.4 oz)   Height:   6\' 5"  (1.956 m)     General: Pt is alert, awake, not in acute distress Cardiovascular: RRR, S1/S2 +, no rubs, no gallops Respiratory: CTA bilaterally, no wheezing, no rhonchi Abdominal: Soft, NT, ND, bowel sounds + Extremities: no edema, no cyanosis    The results of significant diagnostics from this hospitalization (including imaging, microbiology, ancillary and laboratory) are listed below for reference.     Microbiology: No results found for this or any previous visit (from the past 240 hour(s)).   Labs: BNP (last 3 results) No results for input(s): BNP in the last 8760 hours. Basic Metabolic Panel: Recent Labs  Lab 11/28/17 1455 11/29/17 0440  NA 140 140  K 4.2 3.6  CL 105 108  CO2 28 25  GLUCOSE 113* 92  BUN 18 14  CREATININE 1.11 0.84  CALCIUM 9.3 8.3*   Liver Function Tests: Recent Labs  Lab 11/28/17 1455  AST 23  ALT 22  ALKPHOS 54  BILITOT 0.7  PROT 7.1  ALBUMIN 4.3   No results for input(s): LIPASE, AMYLASE in the last 168 hours. No results for input(s): AMMONIA in the last 168 hours. CBC: Recent Labs  Lab 11/28/17 1428 11/29/17 0440  WBC 4.8 5.5  NEUTROABS 2.3  --   HGB 15.5 14.4  HCT 45.9 43.9  MCV 90.7 91.5  PLT 238 204   Cardiac Enzymes: No results for input(s): CKTOTAL, CKMB, CKMBINDEX,  TROPONINI in the last 168 hours. BNP: Invalid input(s): POCBNP CBG: Recent Labs  Lab 11/28/17 1459 11/29/17 0812  GLUCAP 137* 86   D-Dimer No results for input(s): DDIMER in the last 72 hours. Hgb A1c No results for input(s): HGBA1C in the last 72 hours. Lipid Profile No results for input(s): CHOL, HDL, LDLCALC, TRIG, CHOLHDL, LDLDIRECT in the last 72 hours. Thyroid function studies No results for input(s): TSH, T4TOTAL, T3FREE, THYROIDAB in the last 72 hours.  Invalid input(s): FREET3 Anemia work up No results for input(s): VITAMINB12, FOLATE, FERRITIN, TIBC, IRON, RETICCTPCT in the last 72 hours. Urinalysis    Component Value Date/Time   COLORURINE YELLOW 11/28/2017 1605   APPEARANCEUR CLEAR 11/28/2017 1605   LABSPEC 1.018 11/28/2017 1605   PHURINE 7.0 11/28/2017 1605   GLUCOSEU NEGATIVE 11/28/2017 1605   HGBUR NEGATIVE 11/28/2017 1605   BILIRUBINUR  NEGATIVE 11/28/2017 1605   KETONESUR NEGATIVE 11/28/2017 1605   PROTEINUR NEGATIVE 11/28/2017 1605   NITRITE NEGATIVE 11/28/2017 1605   LEUKOCYTESUR NEGATIVE 11/28/2017 1605   Sepsis Labs Invalid input(s): PROCALCITONIN,  WBC,  LACTICIDVEN Microbiology No results found for this or any previous visit (from the past 240 hour(s)).   Time coordinating discharge:  SIGNED:   Erick Blinks, MD  [facility] 11/29/2017, 4:15 PM Pager   If 7PM-7AM, please contact night-coverage [email or web address] Password [email or web address]

## 2017-11-29 NOTE — Progress Notes (Signed)
Removed IV-clean, dry, intact. Reviewed d/c paperwork with patient and girlfriend including new medications and where to pick up. Answered all questions. Wheeled stable patient, belongings, and girlfriend to car.

## 2017-11-30 LAB — HIV ANTIBODY (ROUTINE TESTING W REFLEX): HIV Screen 4th Generation wRfx: NONREACTIVE

## 2019-04-27 ENCOUNTER — Ambulatory Visit
Admission: EM | Admit: 2019-04-27 | Discharge: 2019-04-27 | Disposition: A | Discharge status: Home / Self Care | Payer: BC Managed Care – PPO | Attending: Emergency Medicine | Admitting: Emergency Medicine

## 2019-04-27 ENCOUNTER — Other Ambulatory Visit: Payer: Self-pay

## 2019-04-27 DIAGNOSIS — Z20828 Contact with and (suspected) exposure to other viral communicable diseases: Secondary | ICD-10-CM

## 2019-04-27 DIAGNOSIS — Z20822 Contact with and (suspected) exposure to covid-19: Secondary | ICD-10-CM

## 2019-04-27 NOTE — ED Provider Notes (Signed)
[facility]   [id number] 04/27/19 Arrival Time: 1010   CC: COVID exposure  SUBJECTIVE: History from: patient.  Charles Campbell is a 58 y.o. male who presents for COVID testing.  Admits to COVID exposure at work 4 days ago.  Denies recent travel.  Denies aggravating or alleviating symptoms.  Denies previous COVID infection.   Denies fever, chills, fatigue, nasal congestion, rhinorrhea, sore throat, cough, SOB, wheezing, chest pain, nausea, vomiting, changes in bowel or bladder habits.    ROS: As per HPI.  All other pertinent ROS negative.     Past Medical History:  Diagnosis Date  . Arthritis    Wrist  . H/O hiatal hernia    watches diet  . Hyperlipidemia    Past Surgical History:  Procedure Laterality Date  . COLONOSCOPY  2009  . FINGER SURGERY     Left index- due to injury  . MULTIPLE EXTRACTIONS WITH ALVEOLOPLASTY  12/24/2011   Procedure: MULTIPLE EXTRACION WITH ALVEOLOPLASTY;  Surgeon: [name], DDS;  Location: [facility] OR;  Service: Oral Surgery;  Laterality: Bilateral;   No Known Allergies No current facility-administered medications on file prior to encounter.    Current Outpatient Medications on File Prior to Encounter  Medication Sig Dispense Refill  . aspirin EC 81 MG tablet Take 81 mg by mouth daily.    . cetirizine (ZYRTEC) 5 MG tablet Take 5 mg by mouth daily.    . meclizine (ANTIVERT) 25 MG tablet Take 1 tablet (25 mg total) by mouth 3 (three) times daily as needed for dizziness. 30 tablet 0  . Multiple Vitamins-Minerals (CENTRUM SILVER ULTRA MENS) TABS Take 1 tablet by mouth daily.      . ondansetron (ZOFRAN) 4 MG tablet Take 1 tablet (4 mg total) by mouth every 6 (six) hours as needed for nausea. 20 tablet 0  . vitamin C (ASCORBIC ACID) 500 MG tablet Take 500 mg by mouth daily.     Social History   Socioeconomic History  . Marital status: Divorced    Spouse name: Not on file  . Number of children: 1  . Years of education: 24  . Highest  education level: Not on file  Occupational History  . Occupation: Administrator, [employer]  Social Needs  . Financial resource strain: Not on file  . Food insecurity    Worry: Not on file    Inability: Not on file  . Transportation needs    Medical: Not on file    Non-medical: Not on file  Tobacco Use  . Smoking status: Never Smoker  . Smokeless tobacco: Never Used  Substance and Sexual Activity  . Alcohol use: No  . Drug use: No  . Sexual activity: Not Currently  Lifestyle  . Physical activity    Days per week: Not on file    Minutes per session: Not on file  . Stress: Not on file  Relationships  . Social Musician on phone: Not on file    Gets together: Not on file    Attends religious service: Not on file    Active member of club or organization: Not on file    Attends meetings of clubs or organizations: Not on file    Relationship status: Not on file  . Intimate partner violence    Fear of current or ex partner: Not on file    Emotionally abused: Not on file    Physically abused: Not on file    Forced sexual activity:  Not on file  Other Topics Concern  . Not on file  Social History Narrative   Lives alone   One dog, lab   Family History  Problem Relation Age of Onset  . Cancer Mother        lung  . Asthma Mother   . Heart disease Father 77       heart attack  . Cancer Brother        brain  . Cancer Brother     OBJECTIVE:  Vitals:   04/27/19 1025  BP: (!) 151/87  Pulse: 98  Resp: 16  Temp: 98.4 F (36.9 C)  TempSrc: Oral  SpO2: 98%     General appearance: alert; well-appearing, nontoxic; speaking in full sentences and tolerating own secretions HEENT: NCAT; Ears: EACs clear, TMs pearly gray; Eyes: PERRL.  EOM grossly intact. Nose: nares patent without rhinorrhea, Throat: oropharynx clear, tonsils non erythematous or enlarged, uvula midline  Neck: supple without LAD Lungs: unlabored respirations, symmetrical air entry; cough: absent; no  respiratory distress; CTAB Heart: regular rate and rhythm.  Radial pulses 2+ symmetrical bilaterally Skin: warm and dry Psychological: alert and cooperative; normal mood and affect  ASSESSMENT & PLAN:  1. Exposure to COVID-19 virus     COVID testing ordered.  It will take between 5-7 days for test results.  Someone will contact you regarding abnormal results.    In the meantime: You should remain isolated in your home for 10 days from symptom onset AND greater than 72 hours after symptoms resolution (absence of fever without the use of fever-reducing medication and improvement in respiratory symptoms), whichever is longer OR 14 days from exposure Get plenty of rest and push fluids You may use OTC zyrtec as needed for nasal congestion, runny nose, and/or sore throat You may use OTC flonase as needed for nasal congestion and runny nose Use medications daily for symptom relief Use OTC medications like ibuprofen or tylenol as needed fever or pain Call or go to the ED if you have any new or worsening symptoms such as fever, worsening cough, shortness of breath, chest tightness, chest pain, turning blue, changes in mental status, etc...   Reviewed expectations re: course of current medical issues. Questions answered. Outlined signs and symptoms indicating need for more acute intervention. Patient verbalized understanding. After Visit Summary given.         Lestine Box, PA-C 04/27/19 1359

## 2019-04-27 NOTE — Discharge Instructions (Signed)
COVID testing ordered.  It will take between 5-7 days for test results.  Someone will contact you regarding abnormal results.    In the meantime: You should remain isolated in your home for 10 days from symptom onset AND greater than 72 hours after symptoms resolution (absence of fever without the use of fever-reducing medication and improvement in respiratory symptoms), whichever is longer OR 14 days from exposure Get plenty of rest and push fluids You may use OTC zyrtec as needed for nasal congestion, runny nose, and/or sore throat You may use OTC flonase as needed for nasal congestion and runny nose Use medications daily for symptom relief Use OTC medications like ibuprofen or tylenol as needed fever or pain Call or go to the ED if you have any new or worsening symptoms such as fever, worsening cough, shortness of breath, chest tightness, chest pain, turning blue, changes in mental status, etc..Marland Kitchen

## 2019-04-27 NOTE — ED Triage Notes (Signed)
Pt here for covid testing after positive exposure last week, pt does not have any symptoms

## 2019-04-28 LAB — NOVEL CORONAVIRUS, NAA: SARS-CoV-2, NAA: NOT DETECTED

## 2019-05-19 DIAGNOSIS — I4891 Unspecified atrial fibrillation: Secondary | ICD-10-CM

## 2019-05-19 HISTORY — DX: Unspecified atrial fibrillation: I48.91

## 2019-06-04 ENCOUNTER — Other Ambulatory Visit: Payer: Self-pay

## 2019-06-04 ENCOUNTER — Inpatient Hospital Stay (HOSPITAL_COMMUNITY)
Admission: EM | Admit: 2019-06-04 | Discharge: 2019-06-05 | DRG: 310 | Disposition: A | Discharge status: Home / Self Care | Payer: BC Managed Care – PPO | Attending: Family Medicine | Admitting: Family Medicine

## 2019-06-04 ENCOUNTER — Encounter (HOSPITAL_COMMUNITY): Payer: Self-pay | Admitting: *Deleted

## 2019-06-04 ENCOUNTER — Emergency Department (HOSPITAL_COMMUNITY): Payer: BC Managed Care – PPO

## 2019-06-04 DIAGNOSIS — R7989 Other specified abnormal findings of blood chemistry: Secondary | ICD-10-CM | POA: Diagnosis not present

## 2019-06-04 DIAGNOSIS — Z825 Family history of asthma and other chronic lower respiratory diseases: Secondary | ICD-10-CM | POA: Diagnosis not present

## 2019-06-04 DIAGNOSIS — Z79899 Other long term (current) drug therapy: Secondary | ICD-10-CM | POA: Diagnosis not present

## 2019-06-04 DIAGNOSIS — I4891 Unspecified atrial fibrillation: Principal | ICD-10-CM | POA: Diagnosis present

## 2019-06-04 DIAGNOSIS — E785 Hyperlipidemia, unspecified: Secondary | ICD-10-CM | POA: Diagnosis present

## 2019-06-04 DIAGNOSIS — E7800 Pure hypercholesterolemia, unspecified: Secondary | ICD-10-CM | POA: Diagnosis not present

## 2019-06-04 DIAGNOSIS — Z23 Encounter for immunization: Secondary | ICD-10-CM | POA: Diagnosis not present

## 2019-06-04 DIAGNOSIS — M1990 Unspecified osteoarthritis, unspecified site: Secondary | ICD-10-CM | POA: Diagnosis present

## 2019-06-04 DIAGNOSIS — Z20828 Contact with and (suspected) exposure to other viral communicable diseases: Secondary | ICD-10-CM | POA: Diagnosis present

## 2019-06-04 DIAGNOSIS — Z8249 Family history of ischemic heart disease and other diseases of the circulatory system: Secondary | ICD-10-CM

## 2019-06-04 DIAGNOSIS — R0602 Shortness of breath: Secondary | ICD-10-CM | POA: Diagnosis not present

## 2019-06-04 DIAGNOSIS — N19 Unspecified kidney failure: Secondary | ICD-10-CM | POA: Diagnosis present

## 2019-06-04 DIAGNOSIS — R531 Weakness: Secondary | ICD-10-CM | POA: Diagnosis not present

## 2019-06-04 DIAGNOSIS — Z7982 Long term (current) use of aspirin: Secondary | ICD-10-CM | POA: Diagnosis not present

## 2019-06-04 LAB — BASIC METABOLIC PANEL
Anion gap: 9 (ref 5–15)
BUN: 22 mg/dL — ABNORMAL HIGH (ref 6–20)
CO2: 23 mmol/L (ref 22–32)
Calcium: 8.9 mg/dL (ref 8.9–10.3)
Chloride: 104 mmol/L (ref 98–111)
Creatinine, Ser: 0.96 mg/dL (ref 0.61–1.24)
GFR calc Af Amer: >60 mL/min (ref >60)
GFR calc non Af Amer: >60 mL/min (ref >60)
Glucose, Bld: 105 mg/dL — ABNORMAL HIGH (ref 70–99)
Potassium: 3.9 mmol/L (ref 3.5–5.1)
Sodium: 136 mmol/L (ref 135–145)

## 2019-06-04 LAB — CBC
HCT: 47.9 % (ref 39.0–52.0)
Hemoglobin: 15.7 g/dL (ref 13.0–17.0)
MCH: 29.9 pg (ref 26.0–34.0)
MCHC: 32.8 g/dL (ref 30.0–36.0)
MCV: 91.2 fL (ref 80.0–100.0)
Platelets: 293 10*3/uL (ref 150–400)
RBC: 5.25 MIL/uL (ref 4.22–5.81)
RDW: 13 % (ref 11.5–15.5)
WBC: 6.4 10*3/uL (ref 4.0–10.5)
nRBC: 0 % (ref 0.0–0.2)

## 2019-06-04 LAB — HEPATIC FUNCTION PANEL
ALT: 25 U/L (ref 0–44)
AST: 25 U/L (ref 15–41)
Albumin: 4.5 g/dL (ref 3.5–5.0)
Alkaline Phosphatase: 47 U/L (ref 38–126)
Bilirubin, Direct: 0.1 mg/dL (ref 0.0–0.2)
Indirect Bilirubin: 0.6 mg/dL (ref 0.3–0.9)
Total Bilirubin: 0.7 mg/dL (ref 0.3–1.2)
Total Protein: 7.1 g/dL (ref 6.5–8.1)

## 2019-06-04 LAB — MRSA PCR SCREENING: MRSA by PCR: NEGATIVE

## 2019-06-04 LAB — ETHANOL: Alcohol, Ethyl (B): <10 mg/dL (ref <10)

## 2019-06-04 LAB — TROPONIN I (HIGH SENSITIVITY): Troponin I (High Sensitivity): 3 ng/L (ref <18)

## 2019-06-04 LAB — RESPIRATORY PANEL BY RT PCR (FLU A&B, COVID)
Influenza A by PCR: NEGATIVE
Influenza B by PCR: NEGATIVE
SARS Coronavirus 2 by RT PCR: NEGATIVE

## 2019-06-04 LAB — BRAIN NATRIURETIC PEPTIDE: B Natriuretic Peptide: 39 pg/mL (ref 0.0–100.0)

## 2019-06-04 LAB — MAGNESIUM: Magnesium: 2.4 mg/dL (ref 1.7–2.4)

## 2019-06-04 MED ORDER — ENOXAPARIN SODIUM 40 MG/0.4ML ~~LOC~~ SOLN
40.0000 mg | SUBCUTANEOUS | Status: DC
  Administered 2019-06-05: 40 mg via SUBCUTANEOUS
  Filled 2019-06-04: qty 0.4

## 2019-06-04 MED ORDER — SODIUM CHLORIDE 0.9% FLUSH
3.0000 mL | Freq: Two times a day (BID) | INTRAVENOUS | Status: DC
  Administered 2019-06-04: 3 mL via INTRAVENOUS

## 2019-06-04 MED ORDER — ACETAMINOPHEN 650 MG RE SUPP: 650.0000 mg | Freq: Four times a day (QID) | RECTAL | Status: DC | PRN

## 2019-06-04 MED ORDER — INFLUENZA VAC SPLIT QUAD 0.5 ML IM SUSY
0.5000 mL | PREFILLED_SYRINGE | INTRAMUSCULAR | Status: AC
  Administered 2019-06-05: 0.5 mL via INTRAMUSCULAR
  Filled 2019-06-04: qty 0.5

## 2019-06-04 MED ORDER — CHLORHEXIDINE GLUCONATE CLOTH 2 % EX PADS
6.0000 | MEDICATED_PAD | Freq: Every day | CUTANEOUS | Status: DC
  Administered 2019-06-05: 6 via TOPICAL

## 2019-06-04 MED ORDER — DILTIAZEM HCL-DEXTROSE 125-5 MG/125ML-% IV SOLN (PREMIX)
5.0000 mg/h | INTRAVENOUS | Status: DC
  Administered 2019-06-04: 5 mg/h via INTRAVENOUS
  Filled 2019-06-04: qty 125

## 2019-06-04 MED ORDER — DILTIAZEM HCL 25 MG/5ML IV SOLN
10.0000 mg | Freq: Once | INTRAVENOUS | Status: AC
  Administered 2019-06-04: 10 mg via INTRAVENOUS
  Filled 2019-06-04: qty 5

## 2019-06-04 MED ORDER — ASPIRIN EC 81 MG PO TBEC
81.0000 mg | DELAYED_RELEASE_TABLET | Freq: Every morning | ORAL | Status: DC
  Administered 2019-06-05: 81 mg via ORAL
  Filled 2019-06-04: qty 1

## 2019-06-04 MED ORDER — ACETAMINOPHEN 325 MG PO TABS: 650.0000 mg | ORAL_TABLET | Freq: Four times a day (QID) | ORAL | Status: DC | PRN

## 2019-06-04 NOTE — ED Provider Notes (Signed)
[facility] EMERGENCY DEPARTMENT Provider Note   CSN: [id number] Arrival date & time: 06/04/19  1129     History Chief Complaint  Patient presents with  . Dizziness  . Weakness    Charles Campbell is a 58 y.o. male.  Patient complains of dizziness today.  No chest pain or shortness of breath  The history is provided by the patient.  Dizziness Quality:  Lightheadedness Severity:  Moderate Onset quality:  Sudden Timing:  Constant Chronicity:  New Relieved by:  Nothing Worsened by:  Nothing Associated symptoms: weakness   Associated symptoms: no blood in stool, no chest pain, no diarrhea and no headaches   Weakness Associated symptoms: dizziness   Associated symptoms: no abdominal pain, no chest pain, no cough, no diarrhea, no frequency, no headaches and no seizures        Past Medical History:  Diagnosis Date  . Arthritis    Wrist  . H/O hiatal hernia    watches diet  . Hyperlipidemia     Patient Active Problem List   Diagnosis Date Noted  . Vertigo 11/28/2017  . Intractable nausea and vomiting 11/28/2017  . Syncope and collapse 07/02/2016  . Environmental allergies 07/02/2016    Past Surgical History:  Procedure Laterality Date  . COLONOSCOPY  2009  . FINGER SURGERY     Left index- due to injury  . MULTIPLE EXTRACTIONS WITH ALVEOLOPLASTY  12/24/2011   Procedure: MULTIPLE EXTRACION WITH ALVEOLOPLASTY;  Surgeon: Gae Bon, DDS;  Location: [place];  Service: Oral Surgery;  Laterality: Bilateral;       Family History  Problem Relation Age of Onset  . Cancer Mother        lung  . Asthma Mother   . Heart disease Father 84       heart attack  . Cancer Brother        brain  . Cancer Brother     Social History   Tobacco Use  . Smoking status: Never Smoker  . Smokeless tobacco: Never Used  Substance Use Topics  . Alcohol use: No  . Drug use: No    Home Medications Prior to Admission medications   Medication Sig Start Date End Date Taking?  Authorizing Provider  aspirin EC 81 MG tablet Take 81 mg by mouth every morning.    Yes [provider]  Multiple Vitamins-Minerals (CENTRUM SILVER ULTRA MENS) TABS Take 1 tablet by mouth every morning.    Yes [provider]  naproxen sodium (ALEVE) 220 MG tablet Take 440 mg by mouth every morning.   Yes [provider]  vitamin C (ASCORBIC ACID) 500 MG tablet Take 500 mg by mouth every morning.    Yes [provider]    Allergies    Patient has no known allergies.  Review of Systems   Review of Systems  Constitutional: Negative for appetite change and fatigue.  HENT: Negative for congestion, ear discharge and sinus pressure.   Eyes: Negative for discharge.  Respiratory: Negative for cough.   Cardiovascular: Negative for chest pain.  Gastrointestinal: Negative for abdominal pain, blood in stool and diarrhea.  Genitourinary: Negative for frequency and hematuria.  Musculoskeletal: Negative for back pain.  Skin: Negative for rash.  Neurological: Positive for dizziness and weakness. Negative for seizures and headaches.  Psychiatric/Behavioral: Negative for hallucinations.    Physical Exam Updated Vital Signs BP 117/81   Pulse 79   Temp 98.3 F (36.8 C) (Oral)   Resp 16  SpO2 99%   Physical Exam Vitals and nursing note reviewed.  Constitutional:      Appearance: He is well-developed.  HENT:     Head: Normocephalic.     Nose: Nose normal.  Eyes:     General: No scleral icterus.    Conjunctiva/sclera: Conjunctivae normal.  Neck:     Thyroid: No thyromegaly.  Cardiovascular:     Heart sounds: No murmur. No friction rub. No gallop.      Comments: Rapid irregular rhythm Pulmonary:     Breath sounds: No stridor. No wheezing or rales.  Chest:     Chest wall: No tenderness.  Abdominal:     General: There is no distension.     Tenderness: There is no abdominal tenderness. There is no rebound.  Musculoskeletal:        General: Normal  range of motion.     Cervical back: Neck supple.  Lymphadenopathy:     Cervical: No cervical adenopathy.  Skin:    Findings: No erythema or rash.  Neurological:     Mental Status: He is oriented to person, place, and time.     Motor: No abnormal muscle tone.     Coordination: Coordination normal.  Psychiatric:        Behavior: Behavior normal.     ED Results / Procedures / Treatments   Labs (all labs ordered are listed, but only abnormal results are displayed) Labs Reviewed  BASIC METABOLIC PANEL - Abnormal; Notable for the following components:      Result Value   Glucose, Bld 105 (*)    BUN 22 (*)    All other components within normal limits  RESPIRATORY PANEL BY RT PCR (FLU A&B, COVID)  CBC  HEPATIC FUNCTION PANEL  BRAIN NATRIURETIC PEPTIDE  TROPONIN I (HIGH SENSITIVITY)    EKG EKG Interpretation  Date/Time:  Thursday June 04 2019 12:50:04 EST Ventricular Rate:  179 PR Interval:    QRS Duration: 76 QT Interval:  264 QTC Calculation: 455 R Axis:   73 Text Interpretation: Atrial fibrillation with rapid ventricular response Nonspecific ST abnormality Abnormal ECG New a-fib compared to prior. No STEMI. Confirmed by Alona Bene [phone]) on 06/04/2019 12:55:24 PM   Radiology DG Chest Portable 1 View  Result Date: 06/04/2019 CLINICAL DATA:  Shortness of breath. EXAM: PORTABLE CHEST 1 VIEW COMPARISON:  December 03, 2005. FINDINGS: The heart size and mediastinal contours are within normal limits. Both lungs are clear. No pneumothorax or pleural effusion is noted. The visualized skeletal structures are unremarkable. IMPRESSION: No active disease. Electronically Signed   By: Lupita Raider M.D.   On: 06/04/2019 15:57    Procedures Procedures (including critical care time)  Medications Ordered in ED Medications  diltiazem (CARDIZEM) 125 mg in dextrose 5% 125 mL (1 mg/mL) infusion (5 mg/hr Intravenous New Bag/Given 06/04/19 1556)  diltiazem (CARDIZEM) injection 10 mg  (10 mg Intravenous Given 06/04/19 1551)    ED Course  I have reviewed the triage vital signs and the nursing notes.  Pertinent labs & imaging results that were available during my care of the patient were reviewed by me and considered in my medical decision making (see chart for details). CRITICAL CARE Performed by: Bethann Berkshire Total critical care time: 45 minutes Critical care time was exclusive of separately billable procedures and treating other patients. Critical care was necessary to treat or prevent imminent or life-threatening deterioration. Critical care was time spent personally by me on the following activities: development of treatment  plan with patient and/or surrogate as well as nursing, discussions with consultants, evaluation of patient's response to treatment, examination of patient, obtaining history from patient or surrogate, ordering and performing treatments and interventions, ordering and review of laboratory studies, ordering and review of radiographic studies, pulse oximetry and re-evaluation of patient's condition.    MDM Rules/Calculators/A&P                      Patient with new onset rapid A. fib.  Patient responded to Cardizem IV.  He will be admitted to the hospitalist Final Clinical Impression(s) / ED Diagnoses Final diagnoses:  Atrial fibrillation with RVR [facility](HCC)    Rx / DC Orders ED Discharge Orders    None       Bethann BerkshireZammit, Drew Lips, MD 06/04/19 [phone]

## 2019-06-04 NOTE — ED Triage Notes (Signed)
C/o dizziness and seeing floaters today while at work

## 2019-06-04 NOTE — H&P (Signed)
History and Physical    PLEASE NOTE THAT DRAGON DICTATION SOFTWARE WAS USED IN THE CONSTRUCTION OF THIS NOTE.   Charles Campbell ZOX:[id number] DOB: 08-07-1960 DOA: 06/04/2019  PCP: Patient, No Pcp Per Patient coming from: Home  I have personally briefly reviewed patient's old medical records in [facility]  Chief Complaint: Dizziness  HPI: Charles Campbell is a 58 y.o. male with medical history significant for hyperlipidemia managed with lifestyle modifications, who is admitted to [facility] on 06/04/2019 with new onset atrial fibrillation with RVR after presenting from home to [facility] emergency department complaining of dizziness.  The patient reports 1 day of dizziness associated with lightheadedness in the absence of any associated vertigo, syncope, acute focal weakness, acute focal change in sensation, facial droop, dysarthria, or dysphagia.  He also denies any associated chest pain, palpitations, diaphoresis, shortness of breath, nausea, or vomiting.  The patient confirms no prior known diagnosis of atrial fibrillation.  He also denies any known underlying history of heart failure hypertension, diabetes, or stroke.  Denies any consumption of alcohol or use of recreational drugs.  Denies any orthopnea, PND, or peripheral edema.  Denies any recent subjective fever, chills, rigors, or generalized myalgias. Denies any recent headache, neck stiffness, rhinitis, rhinorrhea, sore throat, cough, abdominal pain, diarrhea, or rash. No recent traveling.  The patient reports that he had a known Covid exposure in the beginning of November 2020, prompting him to be tested for COVID-19 in mid November, at which time his COVID-19 test was found to be negative.  He denies any recent dysuria, gross hematuria, or change in urinary urgency/frequency.   In the setting of a history of osteoarthritis, the patient reports that he takes 2 naproxen every morning, and has done so for several years.  He  denies any abdominal pain, hematemesis, melena, or hematochezia.  He is on a daily baby aspirin as an outpatient, but no additional blood thinning agents.    ED Course: Vital signs in the emergency department were notable for the following: Temperature max 98.3; initial heart rate in the 160s, which is improved into the range of 100-110 bpm following initiation of diltiazem drip; blood pressure is ranged from 107/76-130 3/93; respiratory rate 15-19, and oxygen saturation 97 to 100% on room air.  Labs in the ED were notable for the following: CMP notable for sodium 136, potassium 3.9, BUN 22 compared to most recent prior BUN value of 14 when checked in June 2019, creatinine 0.96, glucose 105; liver enzymes found to be within normal limits.  BNP 39, and high-sensitivity troponin I 3.  CBC notable for white blood cell count of 6400, hemoglobin 15.7.  In anticipation of admission, screening nasopharyngeal COVID-19 PCR was obtained, with result currently pending.  Chest x-ray, per final radiology report showed no evidence of acute cardiopulmonary process.  Presenting EKG showed atrial fibrillation with RVR, ventricular rate 179, normal axis, nonspecific to inversion in lead III and nonspecific less than 1 mm ST depression in V3 through V6, which appears new relative to most recent prior EKG from June 2019.  No evidence of ST elevation.  While in the ED, the following were administered: Diltiazem 10 mg IV x1, followed by initiation of diltiazem drip.  Subsequently, the patient was admitted to the stepdown unit for further evaluation and management of presenting new diagnosis of atrial fibrillation with RVR.    Review of Systems: As per HPI otherwise 10 point review of systems negative.   Past Medical  History:  Diagnosis Date  . Arthritis    Wrist  . H/O hiatal hernia    watches diet  . Hyperlipidemia     Past Surgical History:  Procedure Laterality Date  . COLONOSCOPY  2009  . FINGER SURGERY       Left index- due to injury  . MULTIPLE EXTRACTIONS WITH ALVEOLOPLASTY  12/24/2011   Procedure: MULTIPLE EXTRACION WITH ALVEOLOPLASTY;  Surgeon: Gae Bon, DDS;  Location: ;  Service: Oral Surgery;  Laterality: Bilateral;    Social History:  reports that he has never smoked. He has never used smokeless tobacco. He reports that he does not drink alcohol or use drugs.   No Known Allergies  Family History  Problem Relation Age of Onset  . Cancer Mother        lung  . Asthma Mother   . Heart disease Father 110       heart attack  . Cancer Brother        brain  . Cancer Brother     Prior to Admission medications   Medication Sig Start Date End Date Taking? Authorizing Provider  aspirin EC 81 MG tablet Take 81 mg by mouth every morning.    Yes [provider]  Multiple Vitamins-Minerals (CENTRUM SILVER ULTRA MENS) TABS Take 1 tablet by mouth every morning.    Yes [provider]  naproxen sodium (ALEVE) 220 MG tablet Take 440 mg by mouth every morning.   Yes [provider]  vitamin C (ASCORBIC ACID) 500 MG tablet Take 500 mg by mouth every morning.    Yes [provider]     Objective    Physical Exam: Vitals:   06/04/19 1503 06/04/19 1530 06/04/19 1600 06/04/19 1615  BP: (!) 133/93 117/81    Pulse: (!) 159 64 100 80  Resp: 16 15 12 19   Temp:      TempSrc:      SpO2: 100% 98% 97% 98%    General: appears to be stated age; alert, oriented Skin: warm, dry, no rash Head:  AT/Peculiar Eyes:  PEARL b/l, EOMI Mouth:  Oral mucosa membranes appear moist, normal dentition Neck: supple; trachea midline Heart: Tachycardic, irregular; did not appreciate any M/R/G Lungs: CTAB, did not appreciate any wheezes, rales, or rhonchi Abdomen: + BS; soft, ND, NT Vascular: 2+ pedal pulses b/l; 2+ radial pulses b/l Extremities: no peripheral edema, no muscle wasting   Labs on Admission: I have personally reviewed following labs and imaging  studies  CBC: Recent Labs  Lab 06/04/19 1313  WBC 6.4  HGB 15.7  HCT 47.9  MCV 91.2  PLT 932   Basic Metabolic Panel: Recent Labs  Lab 06/04/19 1313  NA 136  K 3.9  CL 104  CO2 23  GLUCOSE 105*  BUN 22*  CREATININE 0.96  CALCIUM 8.9   GFR: CrCl cannot be calculated (Unknown ideal weight.). Liver Function Tests: Recent Labs  Lab 06/04/19 1313  AST 25  ALT 25  ALKPHOS 47  BILITOT 0.7  PROT 7.1  ALBUMIN 4.5   No results for input(s): LIPASE, AMYLASE in the last 168 hours. No results for input(s): AMMONIA in the last 168 hours. Coagulation Profile: No results for input(s): INR, PROTIME in the last 168 hours. Cardiac Enzymes: No results for input(s): CKTOTAL, CKMB, CKMBINDEX, TROPONINI in the last 168 hours. BNP (last 3 results) No results for input(s): PROBNP in the last 8760 hours. HbA1C: No results for input(s): HGBA1C in the last  72 hours. CBG: No results for input(s): GLUCAP in the last 168 hours. Lipid Profile: No results for input(s): CHOL, HDL, LDLCALC, TRIG, CHOLHDL, LDLDIRECT in the last 72 hours. Thyroid Function Tests: No results for input(s): TSH, T4TOTAL, FREET4, T3FREE, THYROIDAB in the last 72 hours. Anemia Panel: No results for input(s): VITAMINB12, FOLATE, FERRITIN, TIBC, IRON, RETICCTPCT in the last 72 hours. Urine analysis:    Component Value Date/Time   COLORURINE YELLOW 11/28/2017 1605   APPEARANCEUR CLEAR 11/28/2017 1605   LABSPEC 1.018 11/28/2017 1605   PHURINE 7.0 11/28/2017 1605   GLUCOSEU NEGATIVE 11/28/2017 1605   HGBUR NEGATIVE 11/28/2017 1605   BILIRUBINUR NEGATIVE 11/28/2017 1605   KETONESUR NEGATIVE 11/28/2017 1605   PROTEINUR NEGATIVE 11/28/2017 1605   NITRITE NEGATIVE 11/28/2017 1605   LEUKOCYTESUR NEGATIVE 11/28/2017 1605    Radiological Exams on Admission: DG Chest Portable 1 View  Result Date: 06/04/2019 CLINICAL DATA:  Shortness of breath. EXAM: PORTABLE CHEST 1 VIEW COMPARISON:  December 03, 2005. FINDINGS: The  heart size and mediastinal contours are within normal limits. Both lungs are clear. No pneumothorax or pleural effusion is noted. The visualized skeletal structures are unremarkable. IMPRESSION: No active disease. Electronically Signed   By: Lupita RaiderJames  Green Jr M.D.   On: 06/04/2019 15:57    EKG: Independently reviewed, with result as described above.   Assessment/Plan   Jerelyn ScottDwight W Trudo is a 58 y.o. male with medical history significant for hyperlipidemia managed with lifestyle modifications, who is admitted to [facility] on 06/04/2019 with new onset atrial fibrillation with RVR after presenting from home to [facility] emergency department complaining of dizziness.   Principal Problem:   Atrial fibrillation with RVR (HCC) Active Problems:   Acute prerenal azotemia   HLD (hyperlipidemia)   Osteoarthritis   #) Atrial fibrillation with RVR: After presenting with 1 day of dizziness/lightheadedness, patient found to be in atrial fibrillation with RVR, and initial heart rates in the 160s, with rate improving into the low 100s following initiation of diltiazem drip.  Blood pressure appears to have tolerated both the rapid ventricular response as well as the initiation of IV diltiazem.  Interval resolution of the dizziness and lightheadedness following improvement in rate control.  Not associated with any chest pain.  Underlying etiology not currently clear.  No evidence of underlying infectious process at this time, although COVID-19 PCR and urinalysis are pending at this time.  Troponin x1 negative. In the setting of a CHA2DS2-VASc score of 0, there is not a current indication for the patient to be on chronic anticoagulation for thromboembolic prophylaxis in setting of atrial fibrillation.  We will further evaluate for the presence of previously undiagnosed thromboembolic risk factors, as further described below.  In the meantime, will continue on outpatient daily baby aspirin.  Plan: Continue IV  diltiazem with target heart rate less than 105.  Anticipate in the morning that the patient will be started on an oral AV nodal blocking agent, with continuation of IV diltiazem for another 1-2 hours following initiation of this oral agent in order to prevent rebound tachycardia.  Echocardiogram has been ordered for the morning.  Monitor on telemetry.  Add on serum magnesium level to labs already collected in the ED.  Repeat BMP in the morning.  Continue home daily baby aspirin, as above.  Check hemoglobin A1c.  Check TSH, urinalysis, urinary drug screen.  We will also follow for result of screening nasopharyngeal COVID-19 PCR.  Repeat CBC in the morning.  Add on  serum ethanol level to labs are to collected in the ED. Will plan to repeat EKG with interval improvement in rate control.      #) Acute prerenal azotemia: Presenting labs reflect mild prerenal azotemia in the absence of any associated acute kidney injury.  Clinically, the patient does not appear to be significantly dehydrated, and is tolerating p.o. intake.  While the patient reports that he has been on daily naproxen for multiple years, there does not appear to be any overt evidence of acute gastrointestinal bleed at this time.  Plan: Monitor strict I's and O's and daily weights.  Repeat BMP in the morning.  We will hold home naproxen for now.     #) History of hyperlipidemia: The patient reports that he no longer requires statin medication due to interval adherence to lifestyle modifications including dietary modifications as well as exercise and weight loss.  Plan: Ongoing outpatient monitoring by PCP.      #) Osteoarthritis: On daily naproxen, as further described above.  Plan: will hold home naproxen for now, as above.     DVT prophylaxis: Lovenox 40 mg subcu daily Code Status: Full code Family Communication: None Disposition Plan: Per Rounding Team Consults called: None Admission status: Inpatient to stepdown  unit    PLEASE NOTE THAT DRAGON DICTATION SOFTWARE WAS USED IN THE CONSTRUCTION OF THIS NOTE.   Angie Fava DO [facility] Pager [phone] From 3PM- 11PM.   Otherwise, please contact night-coverage  [email or web address] Password [email or web address]  06/04/2019, 4:21 PM

## 2019-06-05 ENCOUNTER — Inpatient Hospital Stay (HOSPITAL_COMMUNITY): Payer: BC Managed Care – PPO

## 2019-06-05 DIAGNOSIS — E785 Hyperlipidemia, unspecified: Secondary | ICD-10-CM | POA: Diagnosis present

## 2019-06-05 DIAGNOSIS — I4891 Unspecified atrial fibrillation: Secondary | ICD-10-CM

## 2019-06-05 DIAGNOSIS — N19 Unspecified kidney failure: Secondary | ICD-10-CM | POA: Diagnosis present

## 2019-06-05 DIAGNOSIS — M1990 Unspecified osteoarthritis, unspecified site: Secondary | ICD-10-CM | POA: Diagnosis present

## 2019-06-05 DIAGNOSIS — R7989 Other specified abnormal findings of blood chemistry: Secondary | ICD-10-CM | POA: Diagnosis present

## 2019-06-05 LAB — CBC
HCT: 46.5 % (ref 39.0–52.0)
Hemoglobin: 15.1 g/dL (ref 13.0–17.0)
MCH: 30.4 pg (ref 26.0–34.0)
MCHC: 32.5 g/dL (ref 30.0–36.0)
MCV: 93.6 fL (ref 80.0–100.0)
Platelets: 248 10*3/uL (ref 150–400)
RBC: 4.97 MIL/uL (ref 4.22–5.81)
RDW: 13 % (ref 11.5–15.5)
WBC: 4.7 10*3/uL (ref 4.0–10.5)
nRBC: 0 % (ref 0.0–0.2)

## 2019-06-05 LAB — BASIC METABOLIC PANEL
Anion gap: 11 (ref 5–15)
BUN: 20 mg/dL (ref 6–20)
CO2: 26 mmol/L (ref 22–32)
Calcium: 8.6 mg/dL — ABNORMAL LOW (ref 8.9–10.3)
Chloride: 105 mmol/L (ref 98–111)
Creatinine, Ser: 1.02 mg/dL (ref 0.61–1.24)
GFR calc Af Amer: >60 mL/min (ref >60)
GFR calc non Af Amer: >60 mL/min (ref >60)
Glucose, Bld: 94 mg/dL (ref 70–99)
Potassium: 4.6 mmol/L (ref 3.5–5.1)
Sodium: 142 mmol/L (ref 135–145)

## 2019-06-05 LAB — TROPONIN I (HIGH SENSITIVITY): Troponin I (High Sensitivity): 5 ng/L (ref <18)

## 2019-06-05 LAB — TSH: TSH: 2.838 u[IU]/mL (ref 0.350–4.500)

## 2019-06-05 LAB — ECHOCARDIOGRAM COMPLETE
Height: 77 in
Weight: 3432.12 oz

## 2019-06-05 LAB — HEMOGLOBIN A1C
Hgb A1c MFr Bld: 5.1 % (ref 4.8–5.6)
Mean Plasma Glucose: 99.67 mg/dL

## 2019-06-05 LAB — HIV ANTIBODY (ROUTINE TESTING W REFLEX): HIV Screen 4th Generation wRfx: NONREACTIVE

## 2019-06-05 LAB — MAGNESIUM: Magnesium: 2.3 mg/dL (ref 1.7–2.4)

## 2019-06-05 MED ORDER — ASPIRIN EC 81 MG PO TBEC: 81.0000 mg | DELAYED_RELEASE_TABLET | Freq: Every day | ORAL | 5 refills | Status: AC

## 2019-06-05 MED ORDER — DILTIAZEM HCL ER COATED BEADS 120 MG PO CP24
120.0000 mg | ORAL_CAPSULE | Freq: Once | ORAL | Status: AC
  Administered 2019-06-05: 120 mg via ORAL
  Filled 2019-06-05: qty 1

## 2019-06-05 MED ORDER — DILTIAZEM HCL 30 MG PO TABS
30.0000 mg | ORAL_TABLET | Freq: Three times a day (TID) | ORAL | Status: DC
  Administered 2019-06-05: 30 mg via ORAL
  Filled 2019-06-05: qty 1

## 2019-06-05 MED ORDER — DILTIAZEM HCL ER COATED BEADS 120 MG PO CP24: 120.0000 mg | ORAL_CAPSULE | Freq: Every day | ORAL | 4 refills | Status: DC

## 2019-06-05 NOTE — Discharge Instructions (Signed)
1)Take Cardizem CD 120 mg daily as prescribed 2)Continue Aspirin 81 mg daily as he did prior to admission but please take it with food 3)Avoid ibuprofen/Advil/Aleve/Motrin/Goody Powders/Naproxen/BC powders/Meloxicam/Diclofenac/Indomethacin and other Nonsteroidal anti-inflammatory medications as these will make you more likely to bleed and can cause stomach ulcers, can also cause Kidney problems.  4)Follow-up to primary care physician within a week for recheck and reevaluation

## 2019-06-05 NOTE — Progress Notes (Signed)
  Echocardiogram 2D Echocardiogram has been performed.  Charles Campbell 06/05/2019, 11:05 AM

## 2019-06-05 NOTE — Discharge Summary (Signed)
Charles Campbell, is a 58 y.o. male  DOB [birth year]  MRN [id number].  Admission date:  06/04/2019  Admitting Physician  Rhetta Mura, DO  Discharge Date:  06/05/2019   Primary MD  Patient, No Pcp Per  Recommendations for primary care physician for things to follow:   1)Take Cardizem CD 120 mg daily as prescribed 2)Continue Aspirin 81 mg daily as he did prior to admission but please take it with food 3)Avoid ibuprofen/Advil/Aleve/Motrin/Goody Powders/Naproxen/BC powders/Meloxicam/Diclofenac/Indomethacin and other Nonsteroidal anti-inflammatory medications as these will make you more likely to bleed and can cause stomach ulcers, can also cause Kidney problems.  4) follow-up to primary care physician within a week for recheck and reevaluation  Admission Diagnosis  Atrial fibrillation with RVR ([place]) [I48.91]   Discharge Diagnosis  Atrial fibrillation with RVR ([place]) [I48.91]  Principal Problem:   Atrial fibrillation with RVR ([place]) Active Problems:   Acute prerenal azotemia   HLD (hyperlipidemia)   Osteoarthritis      Past Medical History:  Diagnosis Date  . Arthritis    Wrist  . H/O hiatal hernia    watches diet  . Hyperlipidemia     Past Surgical History:  Procedure Laterality Date  . COLONOSCOPY  2009  . FINGER SURGERY     Left index- due to injury  . MULTIPLE EXTRACTIONS WITH ALVEOLOPLASTY  12/24/2011   Procedure: MULTIPLE EXTRACION WITH ALVEOLOPLASTY;  Surgeon: Gae Bon, DDS;  Location: [facility];  Service: Oral Surgery;  Laterality: Bilateral;       HPI  from the history and physical done on the day of admission:    - Chief Complaint: Dizziness  HPI: Charles Campbell is a 58 y.o. male with medical history significant for hyperlipidemia managed with lifestyle modifications, who is admitted to [facility] on 06/04/2019 with new onset atrial fibrillation with RVR after  presenting from home to [facility] emergency department complaining of dizziness.  The patient reports 1 day of dizziness associated with lightheadedness in the absence of any associated vertigo, syncope, acute focal weakness, acute focal change in sensation, facial droop, dysarthria, or dysphagia.  He also denies any associated chest pain, palpitations, diaphoresis, shortness of breath, nausea, or vomiting.  The patient confirms no prior known diagnosis of atrial fibrillation.  He also denies any known underlying history of heart failure hypertension, diabetes, or stroke.  Denies any consumption of alcohol or use of recreational drugs.  Denies any orthopnea, PND, or peripheral edema.  Denies any recent subjective fever, chills, rigors, or generalized myalgias. Denies any recent headache, neck stiffness, rhinitis, rhinorrhea, sore throat, cough, abdominal pain, diarrhea, or rash. No recent traveling.  The patient reports that he had a known Covid exposure in the beginning of November 2020, prompting him to be tested for COVID-19 in mid November, at which time his COVID-19 test was found to be negative.  He denies any recent dysuria, gross hematuria, or change in urinary urgency/frequency.   In the setting of a history of osteoarthritis, the patient  reports that he takes 2 naproxen every morning, and has done so for several years.  He denies any abdominal pain, hematemesis, melena, or hematochezia.  He is on a daily baby aspirin as an outpatient, but no additional blood thinning agents.    ED Course: Vital signs in the emergency department were notable for the following: Temperature max 98.3; initial heart rate in the 160s, which is improved into the range of 100-110 bpm following initiation of diltiazem drip; blood pressure is ranged from 107/76-130 3/93; respiratory rate 15-19, and oxygen saturation 97 to 100% on room air.  Labs in the ED were notable for the following: CMP notable for sodium  136, potassium 3.9, BUN 22 compared to most recent prior BUN value of 14 when checked in June 2019, creatinine 0.96, glucose 105; liver enzymes found to be within normal limits.  BNP 39, and high-sensitivity troponin I 3.  CBC notable for white blood cell count of 6400, hemoglobin 15.7.  In anticipation of admission, screening nasopharyngeal COVID-19 PCR was obtained, with result currently pending.  Chest x-ray, per final radiology report showed no evidence of acute cardiopulmonary process.  Presenting EKG showed atrial fibrillation with RVR, ventricular rate 179, normal axis, nonspecific to inversion in lead III and nonspecific less than 1 mm ST depression in V3 through V6, which appears new relative to most recent prior EKG from June 2019.  No evidence of ST elevation.  While in the ED, the following were administered: Diltiazem 10 mg IV x1, followed by initiation of diltiazem drip.  Subsequently, the patient was admitted to the stepdown unit for further evaluation and management of presenting new diagnosis of atrial fibrillation with RVR.    Hospital Course:     1) new onset atrial fibrillation with RVR--- back to sinus rhythm with IV Cardizem, switched over to p.o. Cardizem, -- ambulated in hallways ,remains in sinus rhythm without significant tachycardia or dizziness or chest pains or palpitations or dyspnea -Echocardiogram pulmonary report with preserved EF -TSH is 2.838, magnesium was 2.3, potassium 4.6 and creatinine 1.0 -Troponin and BNP were not elevated  CHA2DS2- VASc score   is = Zero   Which is  equal to = 0.2 % annual risk of stroke  --- No indication for full anticoagulation at this time -Okay to discharge on Cardizem CD 120 mg daily for rate control -Follow-up with PCP for recheck and reevaluation  - Inpatient Criteria Statement:- At the time of admission patient met criteria for inpatient stay due to significant dizziness, symptomatic tachycardia with heart rate in the 160s  and soft blood pressures--- given concerns about hemodynamic instability patient was admitted to inpatient status on stepdown unit-- --however with IV Cardizem and IV fluids patient improved dramatically above and beyond what would be expected -Given the rather rapid improvement beyond expectation patient was switched from IV Cardizem to p.o. Cardizem and will discharge home at this time on oral Cardizem   Discharge Condition: stable  Follow UP--- PCP for recheck as advised   Consults obtained - na  Diet and Activity recommendation:  As advised  Discharge Instructions    Discharge Instructions    Call MD for:  difficulty breathing, headache or visual disturbances   Complete by: As directed    Call MD for:  extreme fatigue   Complete by: As directed    Call MD for:  persistant dizziness or light-headedness   Complete by: As directed    Call MD for:  persistant nausea and vomiting  Complete by: As directed    Call MD for:  severe uncontrolled pain   Complete by: As directed    Call MD for:  temperature >100.4   Complete by: As directed    Diet - low sodium heart healthy   Complete by: As directed    Discharge instructions   Complete by: As directed    1)Take Cardizem CD 120 mg daily as prescribed 2)Continue Aspirin 81 mg daily as he did prior to admission but please take it with food 3)Avoid ibuprofen/Advil/Aleve/Motrin/Goody Powders/Naproxen/BC powders/Meloxicam/Diclofenac/Indomethacin and other Nonsteroidal anti-inflammatory medications as these will make you more likely to bleed and can cause stomach ulcers, can also cause Kidney problems.  4) follow-up to primary care physician within a week for recheck and reevaluation   Increase activity slowly   Complete by: As directed         Discharge Medications     Allergies as of 06/05/2019   No Known Allergies     Medication List    STOP taking these medications   naproxen sodium 220 MG tablet Commonly known as:  ALEVE     TAKE these medications   aspirin EC 81 MG tablet Take 1 tablet (81 mg total) by mouth daily with breakfast. What changed: when to take this   Centrum Silver Ultra Mens Tabs Take 1 tablet by mouth every morning.   diltiazem 120 MG 24 hr capsule Commonly known as: Cardizem CD Take 1 capsule (120 mg total) by mouth daily.   vitamin C 500 MG tablet Commonly known as: ASCORBIC ACID Take 500 mg by mouth every morning.       Major procedures and Radiology Reports - PLEASE review detailed and final reports for all details, in brief -    DG Chest Portable 1 View  Result Date: 06/04/2019 CLINICAL DATA:  Shortness of breath. EXAM: PORTABLE CHEST 1 VIEW COMPARISON:  December 03, 2005. FINDINGS: The heart size and mediastinal contours are within normal limits. Both lungs are clear. No pneumothorax or pleural effusion is noted. The visualized skeletal structures are unremarkable. IMPRESSION: No active disease. Electronically Signed   By: Marijo Conception M.D.   On: 06/04/2019 15:57    Micro Results   Recent Results (from the past 240 hour(s))  Respiratory Panel by RT PCR (Flu A&B, Covid) - Nasopharyngeal Swab     Status: None   Collection Time: 06/04/19  4:15 PM   Specimen: Nasopharyngeal Swab  Result Value Ref Range Status   SARS Coronavirus 2 by RT PCR NEGATIVE NEGATIVE Final    Comment: (NOTE) SARS-CoV-2 target nucleic acids are NOT DETECTED. The SARS-CoV-2 RNA is generally detectable in upper respiratoy specimens during the acute phase of infection. The lowest concentration of SARS-CoV-2 viral copies this assay can detect is 131 copies/mL. A negative result does not preclude SARS-Cov-2 infection and should not be used as the sole basis for treatment or other patient management decisions. A negative result may occur with  improper specimen collection/handling, submission of specimen other than nasopharyngeal swab, presence of viral mutation(s) within the areas targeted by  this assay, and inadequate number of viral copies (<131 copies/mL). A negative result must be combined with clinical observations, patient history, and epidemiological information. The expected result is Negative. Fact Sheet for Patients:  PinkCheek.be Fact Sheet for Healthcare Providers:  GravelBags.it This test is not yet ap proved or cleared by the [place] FDA and  has been authorized for detection and/or diagnosis of SARS-CoV-2 by FDA under  an Emergency Use Authorization (EUA). This EUA will remain  in effect (meaning this test can be used) for the duration of the COVID-19 declaration under Section 564(b)(1) of the Act, 21 U.S.C. section 360bbb-3(b)(1), unless the authorization is terminated or revoked sooner.    Influenza A by PCR NEGATIVE NEGATIVE Final   Influenza B by PCR NEGATIVE NEGATIVE Final    Comment: (NOTE) The Xpert Xpress SARS-CoV-2/FLU/RSV assay is intended as an aid in  the diagnosis of influenza from Nasopharyngeal swab specimens and  should not be used as a sole basis for treatment. Nasal washings and  aspirates are unacceptable for Xpert Xpress SARS-CoV-2/FLU/RSV  testing. Fact Sheet for Patients: PinkCheek.be Fact Sheet for Healthcare Providers: GravelBags.it This test is not yet approved or cleared by the [place] FDA and  has been authorized for detection and/or diagnosis of SARS-CoV-2 by  FDA under an Emergency Use Authorization (EUA). This EUA will remain  in effect (meaning this test can be used) for the duration of the  Covid-19 declaration under Section 564(b)(1) of the Act, 21  U.S.C. section 360bbb-3(b)(1), unless the authorization is  terminated or revoked. Performed at [facility], [address], [place], [place] [address]   MRSA PCR Screening     Status: None   Collection Time: 06/04/19  7:40 PM   Specimen: Nasal  Mucosa; Nasopharyngeal  Result Value Ref Range Status   MRSA by PCR NEGATIVE NEGATIVE Final    Comment:        The GeneXpert MRSA Assay (FDA approved for NASAL specimens only), is one component of a comprehensive MRSA colonization surveillance program. It is not intended to diagnose MRSA infection nor to guide or monitor treatment for MRSA infections. Performed at [facility], [address] [place], [place] [address]        Today   Subjective    Charles Campbell today has no new complaints  -No further dizziness, ambulating hallways without lightheadedness palpitations shortness of breath or chest pains at this time         Patient has been seen and examined prior to discharge   Objective   Blood pressure 101/82, pulse 76, temperature 98 F (36.7 C), temperature source Oral, resp. rate 16, height '6\' 5"'$  (1.956 m), weight 97.3 kg, SpO2 100 %.   Intake/Output Summary (Last 24 hours) at 06/05/2019 1430 Last data filed at 06/05/2019 0420 Gross per 24 hour  Intake 50.37 ml  Output 500 ml  Net -449.63 ml    Exam Gen:- Awake Alert, no acute distress  HEENT:- [place].AT, No sclera icterus Neck-Supple Neck,No JVD,.  Lungs-  CTAB , good air movement bilaterally  CV- S1, S2 normal, regular Abd-  +ve B.Sounds, Abd Soft, No tenderness,    Extremity/Skin:- No  edema,   good pulses Psych-affect is appropriate, oriented x3 Neuro-no new focal deficits, no tremors    Data Review   CBC w Diff:  Lab Results  Component Value Date   WBC 4.7 06/05/2019   HGB 15.1 06/05/2019   HCT 46.5 06/05/2019   PLT 248 06/05/2019   LYMPHOPCT 40 11/28/2017   MONOPCT 7 11/28/2017   EOSPCT 3 11/28/2017   BASOPCT 0 11/28/2017    CMP:  Lab Results  Component Value Date   NA 142 06/05/2019   K 4.6 06/05/2019   CL 105 06/05/2019   CO2 26 06/05/2019   BUN 20 06/05/2019   CREATININE 1.02 06/05/2019   PROT 7.1 06/04/2019   ALBUMIN 4.5 06/04/2019  BILITOT 0.7 06/04/2019   ALKPHOS 47  06/04/2019   AST 25 06/04/2019   ALT 25 06/04/2019  . Total Discharge time is about 33 minutes  Roxan Hockey M.D on 06/05/2019 at 2:30 PM  Go to [email or web address] -  for contact info  [facility] - Office  [phone]

## 2019-06-05 NOTE — Progress Notes (Signed)
Brief note regarding plan, with full H&P to follow:  58 year old male who is admitted with new diagnosis of atrial fibrillation with RVR after presenting to the emergency department complaining of dizziness and lightheadedness.  Currently on diltiazem drip. pending work-up includes echocardiogram, which has been ordered for the morning, TSH, urinary drug screen, and urinalysis.    Babs Bertin, DO Hospitalist

## 2019-07-29 ENCOUNTER — Telehealth: Payer: Self-pay

## 2019-07-29 NOTE — Telephone Encounter (Signed)
Error

## 2019-08-05 ENCOUNTER — Other Ambulatory Visit: Payer: Self-pay

## 2019-08-05 ENCOUNTER — Encounter: Payer: Self-pay | Admitting: Nurse Practitioner

## 2019-08-05 ENCOUNTER — Ambulatory Visit (INDEPENDENT_AMBULATORY_CARE_PROVIDER_SITE_OTHER): Payer: BC Managed Care – PPO | Admitting: Nurse Practitioner

## 2019-08-05 VITALS — Temp 98.4°F | Resp 18 | Ht 77.0 in | Wt 222.6 lb

## 2019-08-05 DIAGNOSIS — R5383 Other fatigue: Secondary | ICD-10-CM

## 2019-08-05 DIAGNOSIS — Z7689 Persons encountering health services in other specified circumstances: Secondary | ICD-10-CM

## 2019-08-05 DIAGNOSIS — R350 Frequency of micturition: Secondary | ICD-10-CM

## 2019-08-05 DIAGNOSIS — Z125 Encounter for screening for malignant neoplasm of prostate: Secondary | ICD-10-CM

## 2019-08-05 DIAGNOSIS — I10 Essential (primary) hypertension: Secondary | ICD-10-CM

## 2019-08-05 DIAGNOSIS — Z1212 Encounter for screening for malignant neoplasm of rectum: Secondary | ICD-10-CM

## 2019-08-05 DIAGNOSIS — Z23 Encounter for immunization: Secondary | ICD-10-CM

## 2019-08-05 DIAGNOSIS — Z833 Family history of diabetes mellitus: Secondary | ICD-10-CM

## 2019-08-05 DIAGNOSIS — R42 Dizziness and giddiness: Secondary | ICD-10-CM

## 2019-08-05 DIAGNOSIS — I4891 Unspecified atrial fibrillation: Secondary | ICD-10-CM | POA: Diagnosis not present

## 2019-08-05 DIAGNOSIS — E7800 Pure hypercholesterolemia, unspecified: Secondary | ICD-10-CM

## 2019-08-05 DIAGNOSIS — L602 Onychogryphosis: Secondary | ICD-10-CM

## 2019-08-05 DIAGNOSIS — I159 Secondary hypertension, unspecified: Secondary | ICD-10-CM

## 2019-08-05 DIAGNOSIS — Z1211 Encounter for screening for malignant neoplasm of colon: Secondary | ICD-10-CM

## 2019-08-05 DIAGNOSIS — R739 Hyperglycemia, unspecified: Secondary | ICD-10-CM

## 2019-08-05 LAB — URINALYSIS, ROUTINE W REFLEX MICROSCOPIC
Bilirubin Urine: NEGATIVE
Glucose, UA: NEGATIVE
Hgb urine dipstick: NEGATIVE
Ketones, ur: NEGATIVE
Leukocytes,Ua: NEGATIVE
Nitrite: NEGATIVE
Protein, ur: NEGATIVE
Specific Gravity, Urine: 1.025 (ref 1.001–1.03)
pH: 7 (ref 5.0–8.0)

## 2019-08-05 LAB — GLUCOSE 16585: Glucose: 103 mg/dL — ABNORMAL HIGH (ref 65–99)

## 2019-08-05 MED ORDER — MECLIZINE HCL 25 MG PO TABS: 25.0000 mg | ORAL_TABLET | Freq: Three times a day (TID) | ORAL | 0 refills | Status: DC | PRN

## 2019-08-05 NOTE — Progress Notes (Signed)
New Patient Office Visit  Subjective:  Patient ID: Charles Campbell, male    DOB: 16-May-1961  Age: 59 y.o. MRN: [id number]  CC:  Chief Complaint  Patient presents with  . Establish Care    HPI Charles Campbell is a 59 y.o. caucasian male who presents to establish care. E=He has a h/o Chronic ventriculomegaly from MRI 2018 that was not followed up on. H/o afib with conversion now on Cardizem no cards specialist. Due for Tdap and cscope. Urinary frequency for a bout a year the same. H/O right abd hernia pt would like general surgeon referral for evaluation. Toenails reported thick he is concerned he has DM with family h/o. He would like podiatry referral. No cp/ct, other gu/gi sxs other than stated, other pain, sob, palpitation, or falls in past year.   Past Medical History:  Diagnosis Date  . Arthritis    Wrist  . H/O hiatal hernia    watches diet  . Hyperlipidemia     Past Surgical History:  Procedure Laterality Date  . COLONOSCOPY  2009  . FINGER SURGERY     Left index- due to injury  . MULTIPLE EXTRACTIONS WITH ALVEOLOPLASTY  12/24/2011   Procedure: MULTIPLE EXTRACION WITH ALVEOLOPLASTY;  Surgeon: Gae Bon, DDS;  Location: [facility];  Service: Oral Surgery;  Laterality: Bilateral;    Family History  Problem Relation Age of Onset  . Cancer Mother        lung  . Asthma Mother   . Heart disease Father 53       heart attack  . Cancer Brother        brain  . Cancer Brother     Social History   Socioeconomic History  . Marital status: Divorced    Spouse name: Not on file  . Number of children: 1  . Years of education: 30  . Highest education level: Not on file  Occupational History  . Occupation: Associate Professor  Tobacco Use  . Smoking status: Never Smoker  . Smokeless tobacco: Never Used  Substance and Sexual Activity  . Alcohol use: No  . Drug use: No  . Sexual activity: Not Currently  Other Topics Concern  . Not on file  Social History Narrative   Lives  alone   One dog, lab   Social Determinants of Health   Financial Resource Strain:   . Difficulty of Paying Living Expenses: Not on file  Food Insecurity:   . Worried About Charity fundraiser in the Last Year: Not on file  . Ran Out of Food in the Last Year: Not on file  Transportation Needs:   . Lack of Transportation (Medical): Not on file  . Lack of Transportation (Non-Medical): Not on file  Physical Activity:   . Days of Exercise per Week: Not on file  . Minutes of Exercise per Session: Not on file  Stress:   . Feeling of Stress : Not on file  Social Connections:   . Frequency of Communication with Friends and Family: Not on file  . Frequency of Social Gatherings with Friends and Family: Not on file  . Attends Religious Services: Not on file  . Active Member of Clubs or Organizations: Not on file  . Attends Archivist Meetings: Not on file  . Marital Status: Not on file  Intimate Partner Violence:   . Fear of Current or Ex-Partner: Not on file  . Emotionally Abused: Not on file  . Physically  Abused: Not on file  . Sexually Abused: Not on file    ROS Review of Systems  All other systems reviewed and are negative.   Objective:   Today's Vitals: Temp 98.4 F (36.9 C) (Oral)   Resp 18   Ht 6\' 5"  (1.956 m)   Wt 222 lb 9.6 oz (101 kg)   SpO2 98%   BMI 26.40 kg/m   Orthostatic Blood pressure: normal.   Physical Exam Vitals and nursing note reviewed. Chaperone present: deffered b pt.  Constitutional:      Appearance: Normal appearance. He is well-developed, well-groomed and normal weight.  HENT:     Head: Normocephalic.     Jaw: There is normal jaw occlusion.     Right Ear: Hearing, tympanic membrane, ear canal and external ear normal.     Left Ear: Hearing, tympanic membrane, ear canal and external ear normal.     Nose: Nose normal.     Mouth/Throat:     Lips: Pink.     Mouth: Mucous membranes are moist.     Pharynx: Oropharynx is clear.  Eyes:      General: Lids are normal. Lids are everted, no foreign bodies appreciated.     Extraocular Movements: Extraocular movements intact.     Conjunctiva/sclera: Conjunctivae normal.     Pupils: Pupils are equal, round, and reactive to light.  Neck:     Thyroid: No thyroid mass, thyromegaly or thyroid tenderness.     Vascular: Normal carotid pulses. No carotid bruit, hepatojugular reflux or JVD.  Cardiovascular:     Rate and Rhythm: Normal rate and regular rhythm.     Pulses: Normal pulses.     Heart sounds: Normal heart sounds, S1 normal and S2 normal.  Pulmonary:     Effort: Pulmonary effort is normal.     Breath sounds: Normal breath sounds.  Abdominal:     General: Abdomen is flat. Bowel sounds are normal.     Palpations: Abdomen is soft.     Tenderness: There is no abdominal tenderness. There is no right CVA tenderness or left CVA tenderness.     Hernia: A hernia is present. There is no hernia in the right inguinal area.    Musculoskeletal:        General: Normal range of motion.     Cervical back: Full passive range of motion without pain, normal range of motion and neck supple.     Right lower leg: No edema.     Left lower leg: No edema.  Feet:     Right foot:     Toenail Condition: Right toenails are abnormally thick.     Left foot:     Toenail Condition: Left toenails are abnormally thick.  Lymphadenopathy:     Head:     Right side of head: No submental, submandibular, tonsillar, preauricular, posterior auricular or occipital adenopathy.     Left side of head: No submental, submandibular, tonsillar, preauricular, posterior auricular or occipital adenopathy.     Cervical: No cervical adenopathy.     Right cervical: No superficial, deep or posterior cervical adenopathy.    Left cervical: No superficial, deep or posterior cervical adenopathy.     Upper Body:     Right upper body: No supraclavicular adenopathy.     Left upper body: No supraclavicular adenopathy.  Skin:     General: Skin is warm and dry.     Capillary Refill: Capillary refill takes less than 2 seconds.  Neurological:  General: No focal deficit present.     Mental Status: He is alert and oriented to person, place, and time.     Cranial Nerves: Cranial nerves are intact.     Sensory: Sensation is intact.     Motor: Motor function is intact.  Psychiatric:        Attention and Perception: Attention and perception normal.        Mood and Affect: Mood and affect normal.        Speech: Speech normal.        Behavior: Behavior normal. Behavior is cooperative.        Thought Content: Thought content normal.        Cognition and Memory: Cognition and memory normal.        Judgment: Judgment normal.     Assessment & Plan:   1. Make appointment with your cardiologist at [facility] right away, EKG in office today was normal. 2. Gi referral for colonoscopy that is overdue 3. Make appointment with your neurologist for follow up that you missed from 2018-19?  4. Right abdominal hernia: referral to general surgeon today 5. Follow up with [place] (primary care) in 6 months with lab draw one week prior to your appointment. 6. MRI of brain follow up from 2018 with vertigo symptoms ongoing wax and whey. H/o Chronic ventriculomegaly 08144 MRI. Negative orthostatic. 7. Tdap vaccination given with 15 minute wait time after without reaction.  Problem List Items Addressed This Visit      Cardiovascular and Mediastinum   Atrial fibrillation with RVR (HCC)   Relevant Orders   COMPLETE METABOLIC PANEL WITH GFR   CBC with Differential/Platelet   EKG 12-Lead (Completed)   EKG 12-Lead   Ambulatory referral to Cardiology     Other   HLD (hyperlipidemia)   Relevant Orders   Lipid panel   COMPLETE METABOLIC PANEL WITH GFR   CBC with Differential/Platelet   Lipid panel    Other Visit Diagnoses    Establishing care with new doctor, encounter for    -  Primary   Relevant Orders   COMPLETE METABOLIC PANEL WITH GFR     CBC with Differential/Platelet   Lipid panel   Secondary hypertension       Relevant Orders   COMPLETE METABOLIC PANEL WITH GFR   CBC with Differential/Platelet   COMPLETE METABOLIC PANEL WITH GFR   CBC with Differential/Platelet   Screening for colorectal cancer       Relevant Orders   Ambulatory referral to Gastroenterology   Need for Tdap vaccination       Relevant Orders   Tdap vaccine greater than or equal to 7yo IM (Completed)   Family history of diabetes mellitus (DM)       Relevant Orders   Glucose, fingerstick (stat)   Fatigue, unspecified type       Relevant Orders   T4, free   TSH   EKG 12-Lead   Ambulatory referral to Cardiology   Blood glucose elevated       Relevant Orders   COMPLETE METABOLIC PANEL WITH GFR   CBC with Differential/Platelet   Hemoglobin A1c   Hemoglobin A1c   Prostate cancer screening       Relevant Orders   PSA   Dizziness       Relevant Orders   EKG 12-Lead   MR Brain Wo Contrast   Ambulatory referral to Cardiology   Urinary frequency       Relevant Orders  Urinalysis, Routine w reflex microscopic (Completed)   Hypertension, unspecified type       Relevant Orders   Ambulatory referral to Cardiology   Overgrown toenails       Relevant Orders   Ambulatory referral to Podiatry      Outpatient Encounter Medications as of 08/05/2019  Medication Sig  . acetaminophen (TYLENOL) 160 MG/5ML elixir Take 15 mg/kg by mouth every 4 (four) hours as needed for fever.  [place] Kitchen aspirin EC 81 MG tablet Take 1 tablet (81 mg total) by mouth daily with breakfast.  . diltiazem (CARDIZEM CD) 120 MG 24 hr capsule Take 1 capsule (120 mg total) by mouth daily.  . Multiple Vitamins-Minerals (CENTRUM SILVER ULTRA MENS) TABS Take 1 tablet by mouth every morning.   . vitamin C (ASCORBIC ACID) 500 MG tablet Take 500 mg by mouth every morning.   . meclizine (ANTIVERT) 25 MG tablet Take 1 tablet (25 mg total) by mouth 3 (three) times daily as needed for dizziness.    No facility-administered encounter medications on file as of 08/05/2019.    Follow-up: Return in about 6 months (around 02/02/2020), or follow up with labs one week prior.   Elmore Guise, FNP

## 2019-08-06 LAB — LIPID PANEL
Cholesterol: 206 mg/dL — ABNORMAL HIGH (ref <200)
HDL: 40 mg/dL (ref >=40)
LDL Cholesterol (Calc): 136 mg/dL (calc) — ABNORMAL HIGH
Non-HDL Cholesterol (Calc): 166 mg/dL (calc) — ABNORMAL HIGH (ref <130)
Total CHOL/HDL Ratio: 5.2 (calc) — ABNORMAL HIGH (ref <5.0)
Triglycerides: 160 mg/dL — ABNORMAL HIGH (ref <150)

## 2019-08-06 LAB — COMPLETE METABOLIC PANEL WITH GFR
AG Ratio: 2 (calc) (ref 1.0–2.5)
ALT: 25 U/L (ref 9–46)
AST: 24 U/L (ref 10–35)
Albumin: 4.4 g/dL (ref 3.6–5.1)
Alkaline phosphatase (APISO): 53 U/L (ref 35–144)
BUN: 17 mg/dL (ref 7–25)
CO2: 27 mmol/L (ref 20–32)
Calcium: 9.5 mg/dL (ref 8.6–10.3)
Chloride: 104 mmol/L (ref 98–110)
Creat: 1.14 mg/dL (ref 0.70–1.25)
GFR, Est African American: 81 mL/min/{1.73_m2} (ref >=60)
GFR, Est Non African American: 70 mL/min/{1.73_m2} (ref >=60)
Globulin: 2.2 g/dL (calc) (ref 1.9–3.7)
Glucose, Bld: 95 mg/dL (ref 65–99)
Potassium: 4.7 mmol/L (ref 3.5–5.3)
Sodium: 139 mmol/L (ref 135–146)
Total Bilirubin: 0.5 mg/dL (ref 0.2–1.2)
Total Protein: 6.6 g/dL (ref 6.1–8.1)

## 2019-08-06 LAB — CBC WITH DIFFERENTIAL/PLATELET
Absolute Monocytes: 379 cells/uL (ref 200–950)
Basophils Absolute: 38 cells/uL (ref 0–200)
Basophils Relative: 0.8 %
Eosinophils Absolute: 139 cells/uL (ref 15–500)
Eosinophils Relative: 2.9 %
HCT: 46.8 % (ref 38.5–50.0)
Hemoglobin: 15.9 g/dL (ref 13.2–17.1)
Lymphs Abs: 1478 cells/uL (ref 850–3900)
MCH: 30.2 pg (ref 27.0–33.0)
MCHC: 34 g/dL (ref 32.0–36.0)
MCV: 89 fL (ref 80.0–100.0)
MPV: 10.6 fL (ref 7.5–12.5)
Monocytes Relative: 7.9 %
Neutro Abs: 2765 cells/uL (ref 1500–7800)
Neutrophils Relative %: 57.6 %
Platelets: 237 10*3/uL (ref 140–400)
RBC: 5.26 10*6/uL (ref 4.20–5.80)
RDW: 12.8 % (ref 11.0–15.0)
Total Lymphocyte: 30.8 %
WBC: 4.8 10*3/uL (ref 3.8–10.8)

## 2019-08-06 LAB — HEMOGLOBIN A1C
Hgb A1c MFr Bld: 5.2 % of total Hgb (ref <5.7)
Mean Plasma Glucose: 103 (calc)
eAG (mmol/L): 5.7 (calc)

## 2019-08-06 LAB — TSH: TSH: 1.67 mIU/L (ref 0.40–4.50)

## 2019-08-06 LAB — PSA: PSA: 0.5 ng/mL (ref <=4.0)

## 2019-08-06 LAB — T4, FREE: Free T4: 1.2 ng/dL (ref 0.8–1.8)

## 2019-08-10 ENCOUNTER — Encounter: Payer: Self-pay | Admitting: Internal Medicine

## 2019-08-10 NOTE — Progress Notes (Signed)
Please let the pt know his labs where stable other than his total cholesterol and triglyceride where slightly elevated. Instruct him to follow low cholesterol and fat diet, exercise 20 minutes at least 3-4 times per week. He may take OTC fish oil. We will still plan to follow up in 6 months as previous plan.

## 2019-08-12 ENCOUNTER — Encounter: Payer: Self-pay | Admitting: Nurse Practitioner

## 2019-08-12 ENCOUNTER — Other Ambulatory Visit: Payer: Self-pay | Admitting: Nurse Practitioner

## 2019-08-12 ENCOUNTER — Telehealth: Payer: Self-pay | Admitting: Nurse Practitioner

## 2019-08-12 DIAGNOSIS — F40240 Claustrophobia: Secondary | ICD-10-CM

## 2019-08-12 MED ORDER — DIAZEPAM 5 MG PO TABS: 5.0000 mg | ORAL_TABLET | ORAL | 0 refills | Status: AC | PRN

## 2019-08-12 NOTE — Telephone Encounter (Signed)
Please ask for open MRI.  I sent script for pt to take one valium 30 minutes prior then one on check in and give third to the MRI nurse to administer if needed. The pt should not drive, have someone drive him to and from the procedure.

## 2019-08-12 NOTE — Telephone Encounter (Signed)
I called patient to notify him of his MRI appointment on 09/02/2019. Patient would like to know if you can give him something for his nerves as he is claustrophobic and he had bad anxiety with his last MRI. Please advise?

## 2019-08-12 NOTE — Telephone Encounter (Signed)
Left message return call

## 2019-08-31 ENCOUNTER — Telehealth: Payer: Self-pay | Admitting: Nurse Practitioner

## 2019-09-01 ENCOUNTER — Encounter: Payer: Self-pay | Admitting: Cardiology

## 2019-09-01 ENCOUNTER — Ambulatory Visit: Payer: BC Managed Care – PPO | Admitting: Cardiology

## 2019-09-01 ENCOUNTER — Other Ambulatory Visit: Payer: Self-pay

## 2019-09-01 VITALS — BP 124/78 | HR 80 | Temp 98.9°F | Wt 223.0 lb

## 2019-09-01 DIAGNOSIS — I4891 Unspecified atrial fibrillation: Secondary | ICD-10-CM | POA: Diagnosis not present

## 2019-09-01 DIAGNOSIS — R42 Dizziness and giddiness: Secondary | ICD-10-CM | POA: Diagnosis not present

## 2019-09-01 NOTE — Patient Instructions (Signed)
Medication Instructions:   Your physician recommends that you continue on your current medications as directed. Please refer to the Current Medication list given to you today.   Labwork: none  Testing/Procedures: Your physician has recommended that you wear an event monitor. Event monitors are medical devices that record the heart's electrical activity. Doctors most often [place] these monitors to diagnose arrhythmias. Arrhythmias are problems with the speed or rhythm of the heartbeat. The monitor is a small, portable device. You can wear one while you do your normal daily activities. This is usually used to diagnose what is causing palpitations/syncope (passing out). 21 days    Follow-Up: Your physician recommends that you schedule a follow-up appointment in: 6 weeks    Any Other Special Instructions Will Be Listed Below (If Applicable).     If you need a refill on your cardiac medications before your next appointment, please call your pharmacy.

## 2019-09-01 NOTE — Progress Notes (Signed)
Clinical Summary Charles Campbell is a 59 y.o.male seen as a new patient for the following medical problems.   1. Afib - new diagnosis during 05/2019 admission - converted back to SR on dilt gtt - CHADS2Vasc score is 0.   - he reports no prior palpitations, admitted with presyncopal episodes.  - still with some lightheadedness at times. 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 Rested for a few minutes and symptoms resolved.  - can occur at rest, even when watching TVs.    Past Medical History:  Diagnosis Date  . Arthritis    Wrist  . H/O hiatal hernia    watches diet  . Hyperlipidemia      No Known Allergies   Current Outpatient Medications  Medication Sig Dispense Refill  . aspirin EC 81 MG tablet Take 1 tablet (81 mg total) by mouth daily with breakfast. 30 tablet 5  . diltiazem (CARDIZEM CD) 120 MG 24 hr capsule Take 1 capsule (120 mg total) by mouth daily. 30 capsule 4  . meclizine (ANTIVERT) 25 MG tablet Take 1 tablet (25 mg total) by mouth 3 (three) times daily as needed for dizziness. 30 tablet 0  . Multiple Vitamins-Minerals (CENTRUM SILVER ULTRA MENS) TABS Take 1 tablet by mouth every morning.     . vitamin C (ASCORBIC ACID) 500 MG tablet Take 500 mg by mouth every morning.      No current facility-administered medications for this visit.     Past Surgical History:  Procedure Laterality Date  . COLONOSCOPY  2009  . FINGER SURGERY     Left index- due to injury  . MULTIPLE EXTRACTIONS WITH ALVEOLOPLASTY  12/24/2011   Procedure: MULTIPLE EXTRACION WITH ALVEOLOPLASTY;  Surgeon: [name], DDS;  Location: [facility] OR;  Service: Oral Surgery;  Laterality: Bilateral;     No Known Allergies    Family History  Problem Relation Age of Onset  . Cancer Mother        lung  . Asthma Mother   . Heart disease Father 33       heart attack  . Cancer Brother        brain  . Cancer Brother       Social History Mr. Musial reports that he has never smoked. He has never used smokeless tobacco. Mr. Schaffert reports no history of alcohol use.   Review of Systems CONSTITUTIONAL: No weight loss, fever, chills, weakness or fatigue.  HEENT: Eyes: No visual loss, blurred vision, double vision or yellow sclerae.No hearing loss, sneezing, congestion, runny nose or sore throat.  SKIN: No rash or itching.  CARDIOVASCULAR: no chest pain, no palpitations.  RESPIRATORY: No shortness of breath, cough or sputum.  GASTROINTESTINAL: No anorexia, nausea, vomiting or diarrhea. No abdominal pain or blood.  GENITOURINARY: No burning on urination, no polyuria NEUROLOGICAL: +dizziness MUSCULOSKELETAL: No muscle, back pain, joint pain or stiffness.  LYMPHATICS: No enlarged nodes. No history of splenectomy.  PSYCHIATRIC: No history of depression or anxiety.  ENDOCRINOLOGIC: No reports of sweating, cold or heat intolerance. No polyuria or polydipsia.  Marland Kitchen   Physical Examination Today's Vitals   09/01/19 0833 09/01/19 0838  BP: 128/74 124/78  Pulse: 80   Temp: 98.9 F (37.2 C)   SpO2: 98%   Weight: 223 lb (101.2 kg)    Body mass index is 26.44 kg/m.  Gen: resting comfortably, no acute distress HEENT: no scleral icterus, pupils  equal round and reactive, no palptable cervical adenopathy,  CV: RRR, no m/r/g, no jvd Resp: Clear to auscultation bilaterally GI: abdomen is soft, non-tender, non-distended, normal bowel sounds, no hepatosplenomegaly MSK: extremities are warm, no edema.  Skin: warm, no rash Neuro:  no focal deficits Psych: appropriate affect   Diagnostic Studies  05/2019 echo IMPRESSIONS    1. Left ventricular ejection fraction, by visual estimation, is 55 to  60%. The left ventricle has normal function. There is mildly increased  left ventricular hypertrophy.  2. The left ventricle has no regional wall motion abnormalities.  3. Global right ventricle has normal systolic  function.The right  ventricular size is normal. No increase in right ventricular wall  thickness.  4. Left atrial size was normal.  5. Right atrial size was normal.  6. Presence of pericardial fat pad.  7. The mitral valve is grossly normal. No evidence of mitral valve  regurgitation.  8. The tricuspid valve is grossly normal. Tricuspid valve regurgitation  is trivial.  9. The aortic valve was not well visualized. Aortic valve regurgitation  is not visualized. Mild aortic valve sclerosis without stenosis.  10. The pulmonic valve was not well visualized. Pulmonic valve  regurgitation is trivial.  11. TR signal is inadequate for assessing pulmonary artery systolic  pressure.  12. The inferior vena cava is normal in size with greater than 50%  respiratory variability, suggesting right atrial pressure of 3 mmHg.    Assessment and Plan  1. Lone afib/Paroxysmal - EKG today shows NSR - ongoing dizziness episodes of unclear etiology, unclear if could be related to afib. Dizziness as his main symptoms when he presented with afib in December - we will obtain 21 day event monitor to further evaluate - continue diltiazem, anticoag is not indicated at this time based on CHADS2CVasc score of 0      Arnoldo Lenis, M.D.

## 2019-09-02 ENCOUNTER — Ambulatory Visit (HOSPITAL_COMMUNITY): Payer: BC Managed Care – PPO

## 2019-09-04 ENCOUNTER — Telehealth: Payer: Self-pay

## 2019-09-04 NOTE — Telephone Encounter (Signed)
Called pt. No answer, left message for pt to return call.  

## 2019-09-04 NOTE — Telephone Encounter (Signed)
Pt states monitor stickers are not staying on.  Please call [phone]  Thanks renee

## 2019-09-08 NOTE — Telephone Encounter (Signed)
Returned pt call. No answer, left message for pt to return call.  

## 2019-09-09 NOTE — Telephone Encounter (Signed)
Pt came in office to get more stickers.

## 2019-09-25 ENCOUNTER — Ambulatory Visit (HOSPITAL_COMMUNITY): Payer: BC Managed Care – PPO

## 2019-10-09 ENCOUNTER — Ambulatory Visit (HOSPITAL_COMMUNITY): Admission: RE | Admit: 2019-10-09 | Payer: BC Managed Care – PPO | Source: Ambulatory Visit

## 2019-10-13 ENCOUNTER — Ambulatory Visit (INDEPENDENT_AMBULATORY_CARE_PROVIDER_SITE_OTHER): Payer: Self-pay | Admitting: *Deleted

## 2019-10-13 ENCOUNTER — Other Ambulatory Visit: Payer: Self-pay

## 2019-10-13 DIAGNOSIS — Z1211 Encounter for screening for malignant neoplasm of colon: Secondary | ICD-10-CM

## 2019-10-13 MED ORDER — PEG 3350-KCL-NA BICARB-NACL 420 G PO SOLR: 4000.0000 mL | Freq: Once | ORAL | 0 refills | Status: AC

## 2019-10-13 NOTE — Patient Instructions (Signed)
Charles Campbell   Dec 29, 1960 MRN: [id number]    Procedure Date: 11/25/2019 Time to register: 8:30 am Place to register: [facility] Procedure Time: 9:30 am  Scheduled provider: Dr. Gala Romney  PREPARATION FOR COLONOSCOPY WITH TRI-LYTE SPLIT PREP  Please notify us immediately if you are diabetic, take iron supplements, or if you are on Coumadin or any other blood thinners.   Please hold the following medications: n/a  You will need to purchase 1 fleet enema and 1 box of Bisacodyl '5mg'$  tablets.   2 DAYS BEFORE PROCEDURE:  DATE: 11/23/2019   DAY: Monday Begin clear liquid diet AFTER your lunch meal. NO SOLID FOODS after this point.  1 DAY BEFORE PROCEDURE:  DATE: 11/24/2019   DAY: Tuesday Continue clear liquids the entire day - NO SOLID FOOD.   Diabetic medications adjustments for today: n/a  At 2:00 pm:  Take 2 Bisacodyl tablets.   At 4:00pm:  Start drinking your solution. Make sure you mix well per instructions on the bottle. Try to drink 1 (one) 8 ounce glass every 10-15 minutes until you have consumed HALF the jug. You should complete by 6:00pm.You must keep the left over solution refrigerated until completed next day.  Continue clear liquids. You must drink plenty of clear liquids to prevent dehyration and kidney failure.     DAY OF PROCEDURE:   DATE: 11/25/2019   DAY:  Wednesday If you take medications for your heart, blood pressure or breathing, you may take these medications.  Diabetic medications adjustments for today: n/a  Five hours before your procedure time @ 4:30 am:  Finish remaining amout of bowel prep, drinking 1 (one) 8 ounce glass every 10-15 minutes until complete. You have two hours to consume remaining prep.   Three hours before your procedure time @ 6:30 am:  Nothing by mouth.   At least one hour before going to the hospital:  Give yourself one Fleet enema. You may take your morning medications with sip of water unless we have instructed otherwise.       Please see below for Dietary Information.  CLEAR LIQUIDS INCLUDE:  Water Jello (NOT red in color)   Ice Popsicles (NOT red in color)   Tea (sugar ok, no milk/cream) Powdered fruit flavored drinks  Coffee (sugar ok, no milk/cream) Gatorade/ Lemonade/ Kool-Aid  (NOT red in color)   Juice: apple, white grape, white cranberry Soft drinks  Clear bullion, consomme, broth (fat free beef/chicken/vegetable)  Carbonated beverages (any kind)  Strained chicken noodle soup Hard Candy   Remember: Clear liquids are liquids that will allow you to see your fingers on the other side of a clear glass. Be sure liquids are NOT red in color, and not cloudy, but CLEAR.  DO NOT EAT OR DRINK ANY OF THE FOLLOWING:  Dairy products of any kind   Cranberry juice Tomato juice / V8 juice   Grapefruit juice Orange juice     Red grape juice  Do not eat any solid foods, including such foods as: cereal, oatmeal, yogurt, fruits, vegetables, creamed soups, eggs, bread, crackers, pureed foods in a blender, etc.   HELPFUL HINTS FOR DRINKING PREP SOLUTION:   Make sure prep is extremely cold. Mix and refrigerate the the morning of the prep. You may also put in the freezer.   You may try mixing some Crystal Light or Country Time Lemonade if you prefer. Mix in small amounts; add more if necessary.  Try drinking through a straw  Rinse mouth  with water or a mouthwash between glasses, to remove after-taste.  Try sipping on a cold beverage /ice/ popsicles between glasses of prep.  Place a piece of sugar-free hard candy in mouth between glasses.  If you become nauseated, try consuming smaller amounts, or stretch out the time between glasses. Stop for 30-60 minutes, then slowly start back drinking.        OTHER INSTRUCTIONS  You will need a responsible adult at least 61 years of age to accompany you and drive you home. This person must remain in the waiting room during your procedure. The hospital will cancel  your procedure if you do not have a responsible adult with you.   1. Wear loose fitting clothing that is easily removed. 2. Leave jewelry and other valuables at home.  3. Remove all body piercing jewelry and leave at home. 4. Total time from sign-in until discharge is approximately 2-3 hours. 5. You should go home directly after your procedure and rest. You can resume normal activities the day after your procedure. 6. The day of your procedure you should not:  Drive  Make legal decisions  Operate machinery  Drink alcohol  Return to work   You may call the office (Dept: [phone]) before 5:00pm, or page the doctor on call [phone]) after 5:00pm, for further instructions, if necessary.   Insurance Information YOU WILL NEED TO CHECK WITH YOUR INSURANCE COMPANY FOR THE BENEFITS OF COVERAGE YOU HAVE FOR THIS PROCEDURE.  UNFORTUNATELY, NOT ALL INSURANCE COMPANIES HAVE BENEFITS TO COVER ALL OR PART OF THESE TYPES OF PROCEDURES.  IT IS YOUR RESPONSIBILITY TO CHECK YOUR BENEFITS, HOWEVER, WE WILL BE GLAD TO ASSIST YOU WITH ANY CODES YOUR INSURANCE COMPANY MAY NEED.    PLEASE NOTE THAT MOST INSURANCE COMPANIES WILL NOT COVER A SCREENING COLONOSCOPY FOR PEOPLE UNDER THE AGE OF 50  IF YOU HAVE BCBS INSURANCE, YOU MAY HAVE BENEFITS FOR A SCREENING COLONOSCOPY BUT IF POLYPS ARE FOUND THE DIAGNOSIS WILL CHANGE AND THEN YOU MAY HAVE A DEDUCTIBLE THAT WILL NEED TO BE MET. SO PLEASE MAKE SURE YOU CHECK YOUR BENEFITS FOR A SCREENING COLONOSCOPY AS WELL AS A DIAGNOSTIC COLONOSCOPY.

## 2019-10-13 NOTE — Progress Notes (Signed)
Given neuro issues with "water on the brain" and dizziness with pending MRI, let's have her do an OV to evaluate for any possible issues.

## 2019-10-13 NOTE — Progress Notes (Addendum)
Gastroenterology Pre-Procedure Review  Request Date: 10/13/2019 Requesting Physician: Lawson Fiscal, NP @ [facility], Pt says last TCS was done when he was 59 yrs old at [facility], no polyps per pt.    PATIENT REVIEW QUESTIONS: The patient responded to the following health history questions as indicated:    1. Diabetes Melitis: no 2. Joint replacements in the past 12 months: no 3. Major health problems in the past 3 months: yes dizziness, pt says he has water on his brain, pt says they found it 3 years ago, has MRI scheduled for 11/06/2019 4. Has an artificial valve or MVP: no 5. Has a defibrillator: no 6. Has been advised in past to take antibiotics in advance of a procedure like teeth cleaning: no 7. Family history of colon cancer: no  8. Alcohol Use: no 9. Illicit drug Use: no 10. History of sleep apnea: no 11. History of coronary artery or other vascular stents placed within the last 12 months: no 12. History of any prior anesthesia complications: no 13. There is no height or weight on file to calculate BMI. ht: 6'5 wt: 222 lbs    MEDICATIONS & ALLERGIES:    Patient reports the following regarding taking any blood thinners:   Plavix? no Aspirin? yes Coumadin? no Brilinta? no Xarelto? no Eliquis? no Pradaxa? no Savaysa? no Effient? no  Patient confirms/reports the following medications:  Current Outpatient Medications  Medication Sig Dispense Refill  . acetaminophen (TYLENOL) 500 MG tablet Take 500 mg by mouth as needed.    Marland Kitchen aspirin EC 81 MG tablet Take 1 tablet (81 mg total) by mouth daily with breakfast. 30 tablet 5  . diltiazem (CARDIZEM CD) 120 MG 24 hr capsule Take 1 capsule (120 mg total) by mouth daily. 30 capsule 4  . meclizine (ANTIVERT) 25 MG tablet Take 1 tablet (25 mg total) by mouth 3 (three) times daily as needed for dizziness. (Patient taking differently: Take 25 mg by mouth as needed for dizziness. ) 30 tablet 0  . Multiple Vitamins-Minerals  (CENTRUM SILVER ULTRA MENS) TABS Take 1 tablet by mouth every morning.     . Omega-3 Fatty Acids (FISH OIL) 1000 MG CAPS Take by mouth daily.     . vitamin C (ASCORBIC ACID) 500 MG tablet Take 500 mg by mouth every morning.      No current facility-administered medications for this visit.    Patient confirms/reports the following allergies:  No Known Allergies  No orders of the defined types were placed in this encounter.   AUTHORIZATION INFORMATION Primary Insurance: [place],  [place] #:[id number] ,  Group #: [id number] Pre-Cert /Auth required: No, not required  SCHEDULE INFORMATION: Procedure has been scheduled as follows:  Date: 11/25/2019, Time: 9:30 Location: [facility] with Dr. Jena Gauss  This Gastroenterology Pre-Precedure Review Form is being routed to the following provider(s): Wynne Dust, NP

## 2019-10-14 ENCOUNTER — Telehealth: Payer: Self-pay | Admitting: *Deleted

## 2019-10-14 NOTE — Telephone Encounter (Signed)
Lmom for pt to call me back. 

## 2019-10-14 NOTE — Progress Notes (Signed)
Called pt and informed him that he would need an ov based on his neuro issues and pending MRI.  Pt voiced understanding.  Pt scheduled an ov for 10/28/2019 at 2:00 with LSL.

## 2019-10-21 ENCOUNTER — Encounter: Payer: Self-pay | Admitting: Student

## 2019-10-21 ENCOUNTER — Ambulatory Visit (INDEPENDENT_AMBULATORY_CARE_PROVIDER_SITE_OTHER): Payer: BC Managed Care – PPO | Admitting: Student

## 2019-10-21 ENCOUNTER — Other Ambulatory Visit: Payer: Self-pay

## 2019-10-21 ENCOUNTER — Ambulatory Visit (INDEPENDENT_AMBULATORY_CARE_PROVIDER_SITE_OTHER): Payer: BC Managed Care – PPO

## 2019-10-21 VITALS — BP 124/78 | HR 90 | Ht 77.0 in | Wt 222.8 lb

## 2019-10-21 DIAGNOSIS — I480 Paroxysmal atrial fibrillation: Secondary | ICD-10-CM

## 2019-10-21 DIAGNOSIS — R42 Dizziness and giddiness: Secondary | ICD-10-CM

## 2019-10-21 NOTE — Patient Instructions (Signed)
Medication Instructions:  Your physician recommends that you continue on your current medications as directed. Please refer to the Current Medication list given to you today.  *If you need a refill on your cardiac medications before your next appointment, please call your pharmacy*   Lab Work: NONE   If you have labs (blood work) drawn today and your tests are completely normal, you will receive your results only by: Marland Kitchen MyChart Message (if you have MyChart) OR . A paper copy in the mail If you have any lab test that is abnormal or we need to change your treatment, we will call you to review the results.   Testing/Procedures: Your physician has recommended that you wear an event monitor. Event monitors are medical devices that record the heart's electrical activity. Doctors most often [place] these monitors to diagnose arrhythmias. Arrhythmias are problems with the speed or rhythm of the heartbeat. The monitor is a small, portable device. You can wear one while you do your normal daily activities. This is usually used to diagnose what is causing palpitations/syncope (passing out).     Follow-Up: At [facility], you and your health needs are our priority.  As part of our continuing mission to provide you with exceptional heart care, we have created designated Provider Care Teams.  These Care Teams include your primary Cardiologist (physician) and Advanced Practice Providers (APPs -  Physician Assistants and Nurse Practitioners) who all work together to provide you with the care you need, when you need it.  We recommend signing up for the patient portal called "MyChart".  Sign up information is provided on this After Visit Summary.  MyChart is used to connect with patients for Virtual Visits (Telemedicine).  Patients are able to view lab/test results, encounter notes, upcoming appointments, etc.  Non-urgent messages can be sent to your provider as well.   To learn more about what you can do with  MyChart, go to [email or web address].    Your next appointment:   6 month(s)  The format for your next appointment:   In Person  Provider:   Dina Rich, MD   Other Instructions Thank you for choosing [facility]!

## 2019-10-21 NOTE — Progress Notes (Signed)
Cardiology Office Note    Date:  10/22/2019   ID:  Uzair, Godley 1960-11-27, MRN [id number]  PCP:  Elmore Guise, FNP  Cardiologist: Dina Rich, MD    Chief Complaint  Patient presents with  . Follow-up    2 month visit    History of Present Illness:    Charles Campbell is a 59 y.o. male with past medical history of paroxysmal atrial fibrillation and HLD (diet-controlled) who presents to the office today for 10-month follow-up.   He was last examined by Dr. Wyline Mood in 08/2019 for follow-up as he had been admitted in 05/2019 for new-onset atrial fibrillation and converted back to NSR with IV Cardizem. He denied any palpitations at the time of his visit but reported intermittent dizzy spells. It was recommended he have a 21-day event monitor to assess for recurrence. He was continued on Cardizem CD 120mg  daily and was not started on anticoagulation given his CHA2DS2-VASc Score of 0.   His monitor has not yet resulted on Epic and by review of the [employer] website, it was only worn for 1% of the time and showed no significant arrhythmias.  In talking with the patient today, he reports wearing the monitor for the full 3 weeks and is very frustrated this did not record. He did have some difficulty with the monitor staying in place and used a Band-Aid to hold it in place at times.   He continues to have intermittent dizzy episodes which have no identifiable trigger. No associated chest pain, palpitations, headaches or vision changes. Not associated with exertion or positional changes. He has taken Meclizine with no improvement in his symptoms. He is scheduled for a Brain MRI later this month which was arranged by his PCP.    Past Medical History:  Diagnosis Date  . Arthritis    Wrist  . H/O hiatal hernia    watches diet  . Hyperlipidemia     Past Surgical History:  Procedure Laterality Date  . COLONOSCOPY  2009  . FINGER SURGERY     Left index- due to injury  .  MULTIPLE EXTRACTIONS WITH ALVEOLOPLASTY  12/24/2011   Procedure: MULTIPLE EXTRACION WITH ALVEOLOPLASTY;  Surgeon: 02/24/2012, DDS;  Location: [facility] OR;  Service: Oral Surgery;  Laterality: Bilateral;    Current Medications: Outpatient Medications Prior to Visit  Medication Sig Dispense Refill  . acetaminophen (TYLENOL) 500 MG tablet Take 500 mg by mouth as needed.    [name] aspirin EC 81 MG tablet Take 1 tablet (81 mg total) by mouth daily with breakfast. 30 tablet 5  . diltiazem (CARDIZEM CD) 120 MG 24 hr capsule Take 1 capsule (120 mg total) by mouth daily. 30 capsule 4  . meclizine (ANTIVERT) 25 MG tablet Take 1 tablet (25 mg total) by mouth 3 (three) times daily as needed for dizziness. (Patient taking differently: Take 25 mg by mouth as needed for dizziness. ) 30 tablet 0  . Multiple Vitamins-Minerals (CENTRUM SILVER ULTRA MENS) TABS Take 1 tablet by mouth every morning.     . Omega-3 Fatty Acids (FISH OIL) 1000 MG CAPS Take by mouth daily.     . vitamin C (ASCORBIC ACID) 500 MG tablet Take 500 mg by mouth every morning.      No facility-administered medications prior to visit.     Allergies:   Patient has no known allergies.   Social History   Socioeconomic History  . Marital status: Divorced    Spouse name:  Not on file  . Number of children: 1  . Years of education: 32  . Highest education level: Not on file  Occupational History  . Occupation: Administrator, [employer] Service  Tobacco Use  . Smoking status: Never Smoker  . Smokeless tobacco: Never Used  Substance and Sexual Activity  . Alcohol use: No  . Drug use: No  . Sexual activity: Not Currently  Other Topics Concern  . Not on file  Social History Narrative   Lives alone   One dog, lab   Social Determinants of Health   Financial Resource Strain:   . Difficulty of Paying Living Expenses:   Food Insecurity:   . Worried About Programme researcher, broadcasting/film/video in the Last Year:   . Barista in the Last Year:   Transportation Needs:   .  Freight forwarder (Medical):   Marland Kitchen Lack of Transportation (Non-Medical):   Physical Activity:   . Days of Exercise per Week:   . Minutes of Exercise per Session:   Stress:   . Feeling of Stress :   Social Connections:   . Frequency of Communication with Friends and Family:   . Frequency of Social Gatherings with Friends and Family:   . Attends Religious Services:   . Active Member of Clubs or Organizations:   . Attends Banker Meetings:   Marland Kitchen Marital Status:      Family History:  The patient's family history includes Asthma in his mother; Cancer in his brother, brother, and mother; Heart disease (age of onset: 78) in his father.   Review of Systems:   Please see the history of present illness.     General:  No chills, fever, night sweats or weight changes.  Cardiovascular:  No chest pain, dyspnea on exertion, edema, orthopnea, palpitations, paroxysmal nocturnal dyspnea. Dermatological: No rash, lesions/masses Respiratory: No cough, dyspnea Urologic: No hematuria, dysuria Abdominal:   No nausea, vomiting, diarrhea, bright red blood per rectum, melena, or hematemesis Neurologic:  No visual changes, wkns, changes in mental status. Positive for dizziness.   All other systems reviewed and are otherwise negative except as noted above.   Physical Exam:    VS:  BP 124/78   Pulse 90   Ht 6\' 5"  (1.956 m)   Wt 222 lb 12.8 oz (101.1 kg)   SpO2 96%   BMI 26.42 kg/m    General: Well developed, well nourished,male appearing in no acute distress. Head: Normocephalic, atraumatic, sclera non-icteric.  Neck: No carotid bruits. JVD not elevated.  Lungs: Respirations regular and unlabored, without wheezes or rales.  Heart: Regular rate and rhythm. No S3 or S4.  No murmur, no rubs, or gallops appreciated. Abdomen: Soft, non-tender, non-distended. No obvious abdominal masses. Msk:  Strength and tone appear normal for age. No obvious joint deformities or effusions. Extremities:  No clubbing or cyanosis. No edema.  Distal pedal pulses are 2+ bilaterally. Neuro: Alert and oriented X 3. Moves all extremities spontaneously. No focal deficits noted. Psych:  Responds to questions appropriately with a normal affect. Skin: No rashes or lesions noted  Wt Readings from Last 3 Encounters:  10/21/19 222 lb 12.8 oz (101.1 kg)  09/01/19 223 lb (101.2 kg)  08/05/19 222 lb 9.6 oz (101 kg)     Studies/Labs Reviewed:   EKG:  EKG is not ordered today. EKG from 09/01/2019 is reviewed which shows NSR, HR 65 with incomplete RBBB.   Recent Labs: 06/04/2019: B Natriuretic Peptide 39.0 06/05/2019: Magnesium 2.3 08/05/2019:  ALT 25; BUN 17; Creat 1.14; Hemoglobin 15.9; Platelets 237; Potassium 4.7; Sodium 139; TSH 1.67   Lipid Panel    Component Value Date/Time   CHOL 206 (H) 08/05/2019 0935   TRIG 160 (H) 08/05/2019 0935   HDL 40 08/05/2019 0935   CHOLHDL 5.2 (H) 08/05/2019 0935   LDLCALC 136 (H) 08/05/2019 0935    Additional studies/ records that were reviewed today include:   Echocardiogram: 05/2019 IMPRESSIONS    1. Left ventricular ejection fraction, by visual estimation, is 55 to  60%. The left ventricle has normal function. There is mildly increased  left ventricular hypertrophy.  2. The left ventricle has no regional wall motion abnormalities.  3. Global right ventricle has normal systolic function.The right  ventricular size is normal. No increase in right ventricular wall  thickness.  4. Left atrial size was normal.  5. Right atrial size was normal.  6. Presence of pericardial fat pad.  7. The mitral valve is grossly normal. No evidence of mitral valve  regurgitation.  8. The tricuspid valve is grossly normal. Tricuspid valve regurgitation  is trivial.  9. The aortic valve was not well visualized. Aortic valve regurgitation  is not visualized. Mild aortic valve sclerosis without stenosis.  10. The pulmonic valve was not well visualized. Pulmonic  valve  regurgitation is trivial.  11. TR signal is inadequate for assessing pulmonary artery systolic  pressure.  12. The inferior vena cava is normal in size with greater than 50%  respiratory variability, suggesting right atrial pressure of 3 mmHg.   Assessment:    1. PAF (paroxysmal atrial fibrillation) ([place])   2. Dizziness      Plan:   In order of problems listed above:  1. Paroxysmal Atrial Fibrillation/ Dizziness - the patient continues to have episodic dizziness with no identifiable trigger. No associated symptoms when this occurs and does not improve with Meclizine. He did have atrial fibrillation with RVR in 05/2019 but converted back to NSR. He remains on Cardizem CD 120mg  daily but is not on anticoagulation given his CHA2DS2-VASc Score of 0. Unfortunately, his monitor only recorded for 1% of the total time and office staff have reached out to the [employer] representative today and they were unable to locate any additional data as well. They will arrange for him to have a repeat monitor at no additional cost. Will follow-up with the results once available. Continue with plans for Brain MRI per PCP.    Medication Adjustments/Labs and Tests Ordered: Current medicines are reviewed at length with the patient today.  Concerns regarding medicines are outlined above.  Medication changes, Labs and Tests ordered today are listed in the Patient Instructions below. Patient Instructions  Medication Instructions:  Your physician recommends that you continue on your current medications as directed. Please refer to the Current Medication list given to you today.  *If you need a refill on your cardiac medications before your next appointment, please call your pharmacy*   Lab Work: NONE   If you have labs (blood work) drawn today and your tests are completely normal, you will receive your results only by: Marland Kitchen MyChart Message (if you have MyChart) OR . A paper copy in the mail If you have  any lab test that is abnormal or we need to change your treatment, we will call you to review the results.   Testing/Procedures: Your physician has recommended that you wear an event monitor. Event monitors are medical devices that record the heart's electrical activity. Doctors most  often [place] these monitors to diagnose arrhythmias. Arrhythmias are problems with the speed or rhythm of the heartbeat. The monitor is a small, portable device. You can wear one while you do your normal daily activities. This is usually used to diagnose what is causing palpitations/syncope (passing out).     Follow-Up: At [facility], you and your health needs are our priority.  As part of our continuing mission to provide you with exceptional heart care, we have created designated Provider Care Teams.  These Care Teams include your primary Cardiologist (physician) and Advanced Practice Providers (APPs -  Physician Assistants and Nurse Practitioners) who all work together to provide you with the care you need, when you need it.  We recommend signing up for the patient portal called "MyChart".  Sign up information is provided on this After Visit Summary.  MyChart is used to connect with patients for Virtual Visits (Telemedicine).  Patients are able to view lab/test results, encounter notes, upcoming appointments, etc.  Non-urgent messages can be sent to your provider as well.   To learn more about what you can do with MyChart, go to [email or web address].    Your next appointment:   6 month(s)  The format for your next appointment:   In Person  Provider:   Dina Rich, MD   Other Instructions Thank you for choosing [facility]!       Signed, Charles Lennox, PA-C  10/22/2019 9:00 AM    [facility] [address] [place], [place] [address] Phone: [phone] Fax: [phone]

## 2019-10-22 ENCOUNTER — Encounter: Payer: Self-pay | Admitting: Student

## 2019-10-23 ENCOUNTER — Encounter: Payer: Self-pay | Admitting: Podiatry

## 2019-10-23 ENCOUNTER — Ambulatory Visit (INDEPENDENT_AMBULATORY_CARE_PROVIDER_SITE_OTHER): Payer: BC Managed Care – PPO

## 2019-10-23 ENCOUNTER — Ambulatory Visit (INDEPENDENT_AMBULATORY_CARE_PROVIDER_SITE_OTHER): Payer: BC Managed Care – PPO | Admitting: Podiatry

## 2019-10-23 ENCOUNTER — Other Ambulatory Visit: Payer: Self-pay

## 2019-10-23 VITALS — BP 164/96 | HR 72 | Temp 97.9°F

## 2019-10-23 DIAGNOSIS — M129 Arthropathy, unspecified: Secondary | ICD-10-CM | POA: Diagnosis not present

## 2019-10-23 DIAGNOSIS — L84 Corns and callosities: Secondary | ICD-10-CM | POA: Diagnosis not present

## 2019-10-23 DIAGNOSIS — M2042 Other hammer toe(s) (acquired), left foot: Secondary | ICD-10-CM

## 2019-10-23 DIAGNOSIS — M21622 Bunionette of left foot: Secondary | ICD-10-CM

## 2019-10-23 DIAGNOSIS — M2041 Other hammer toe(s) (acquired), right foot: Secondary | ICD-10-CM | POA: Diagnosis not present

## 2019-10-23 NOTE — Progress Notes (Signed)
This patient presents the office with chief complaint of painful calluses especially on his right forefoot.  He says that this is painful walking wearing his shoes.. 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  He also says that the toes on both of his feet are painful by the end of the day.  He says that the toes rub against his shoes at work.  Finally he also has a painful callus on the outside of his left forefoot.  He presents the office today for an evaluation and treatment of his feet.  Vascular  Dorsalis pedis and posterior tibial pulses are palpable  B/L.  Capillary return  WNL.  Temperature gradient is  WNL.  Skin turgor  WNL  Sensorium  Senn Weinstein monofilament wire  WNL. Normal tactile sensation.  Nail Exam  Patient has normal nails with no evidence of bacterial or fungal infection.  Orthopedic  Exam  Muscle tone and muscle strength  WNL.  No limitations of motion feet  B/L.  No crepitus or joint effusion noted.  Foot type is unremarkable and digits show no abnormalities.  Bony prominences are unremarkable. Midfoot  DJD  B/L.  Hammer toes 2-5  B/l.  Left hallux deviated laterally left hallux.  Right hallux deviated medially at IPJ.  Tailors bunion 5th metatarsal left foot.  Skin  No open lesions.  Normal skin texture and turgor.  Callus lateral aspect tailors bunion left.  Porokeratosis sub 2 left foot secondary severely hammered second digit right foot.  Callus sub 1 left foot.  Porokeratosis/callus feet  B/L  Hammer toes  2-5  B/l.  Probable  S/P fracture right hallux..   IE.   Xrays reveal probable fracture at base of distal phalanx right hallux.  Debridement of callus with # 15 blade.  Discussed hammertoes with this patient.  Told him we can treat him conservatively or we can consider surgical intervention to straighten the elevated toes on both feet in the future.  I took the x-rays of his right hallux due to limited range of motion of the IPJ thinking there could be a  true bony pathology.  Return to clinic as needed.  Patient was given a gel dispersion form  for him to acquire the pad in the future.  RTC prn   Helane Gunther DPM

## 2019-10-26 ENCOUNTER — Telehealth: Payer: Self-pay | Admitting: *Deleted

## 2019-10-26 NOTE — Telephone Encounter (Signed)
Jerelyn Scott, you are scheduled for a virtual visit with your provider today.  Just as we do with appointments in the office, we must obtain your consent to participate.  Your consent will be active for this visit and any virtual visit you may have with one of our providers in the next 365 days.  If you have a MyChart account, I can also send a copy of this consent to you electronically.  All virtual visits are billed to your insurance company just like a traditional visit in the office.  As this is a virtual visit, video technology does not allow for your provider to perform a traditional examination.  This may limit your provider's ability to fully assess your condition.  If your provider identifies any concerns that need to be evaluated in person or the need to arrange testing such as labs, EKG, etc, we will make arrangements to do so.  Although advances in technology are sophisticated, we cannot ensure that it will always work on either your end or our end.  If the connection with a video visit is poor, we may have to switch to a telephone visit.  With either a video or telephone visit, we are not always able to ensure that we have a secure connection.   I need to obtain your verbal consent now.   Are you willing to proceed with your visit today?

## 2019-10-26 NOTE — Telephone Encounter (Signed)
Pt consented to a virtual visit. 

## 2019-10-28 ENCOUNTER — Encounter: Payer: Self-pay | Admitting: Gastroenterology

## 2019-10-28 ENCOUNTER — Encounter: Payer: Self-pay | Admitting: Internal Medicine

## 2019-10-28 ENCOUNTER — Telehealth (INDEPENDENT_AMBULATORY_CARE_PROVIDER_SITE_OTHER): Payer: BC Managed Care – PPO | Admitting: Gastroenterology

## 2019-10-28 ENCOUNTER — Telehealth: Payer: Self-pay | Admitting: Internal Medicine

## 2019-10-28 ENCOUNTER — Other Ambulatory Visit: Payer: Self-pay

## 2019-10-28 ENCOUNTER — Telehealth: Payer: Self-pay | Admitting: *Deleted

## 2019-10-28 DIAGNOSIS — Z1211 Encounter for screening for malignant neoplasm of colon: Secondary | ICD-10-CM

## 2019-10-28 NOTE — Progress Notes (Signed)
Patient no showed

## 2019-10-28 NOTE — Telephone Encounter (Signed)
Called patient at 1:45pm and at 2:03pm and I left message both times. No call back prior to 2:15pm.

## 2019-10-28 NOTE — Telephone Encounter (Signed)
PATIENT WAS A NO SHOW AND LETTER SENT  °

## 2019-10-28 NOTE — Telephone Encounter (Signed)
NO SHOWED PATIENT AND MAILED LETTER

## 2019-11-06 ENCOUNTER — Other Ambulatory Visit (HOSPITAL_COMMUNITY): Payer: BC Managed Care – PPO

## 2019-11-23 ENCOUNTER — Other Ambulatory Visit (HOSPITAL_COMMUNITY): Payer: BC Managed Care – PPO

## 2020-01-26 ENCOUNTER — Other Ambulatory Visit: Payer: Self-pay

## 2020-01-26 ENCOUNTER — Encounter: Payer: Self-pay | Admitting: Emergency Medicine

## 2020-01-26 ENCOUNTER — Ambulatory Visit
Admission: EM | Admit: 2020-01-26 | Discharge: 2020-01-26 | Disposition: A | Discharge status: Home / Self Care | Payer: BC Managed Care – PPO | Attending: Emergency Medicine | Admitting: Emergency Medicine

## 2020-01-26 DIAGNOSIS — J069 Acute upper respiratory infection, unspecified: Secondary | ICD-10-CM

## 2020-01-26 DIAGNOSIS — Z1152 Encounter for screening for COVID-19: Secondary | ICD-10-CM

## 2020-01-26 DIAGNOSIS — A084 Viral intestinal infection, unspecified: Secondary | ICD-10-CM | POA: Diagnosis not present

## 2020-01-26 DIAGNOSIS — R6889 Other general symptoms and signs: Secondary | ICD-10-CM | POA: Diagnosis not present

## 2020-01-26 MED ORDER — PREDNISONE 10 MG PO TABS: 20.0000 mg | ORAL_TABLET | Freq: Every day | ORAL | 0 refills | Status: DC

## 2020-01-26 MED ORDER — BENZONATATE 100 MG PO CAPS: 100.0000 mg | ORAL_CAPSULE | Freq: Three times a day (TID) | ORAL | 0 refills | Status: DC

## 2020-01-26 MED ORDER — ONDANSETRON 4 MG PO TBDP: 4.0000 mg | ORAL_TABLET | Freq: Three times a day (TID) | ORAL | 0 refills | Status: DC | PRN

## 2020-01-26 NOTE — ED Triage Notes (Signed)
Headache, coughing up phlegmon, diarrhea and threw up x 1 x 1 week.  Pt states he got the covid vaccine x 1week ago and that's when he started feeling bad.  Pt's tongue appears to have some open areas.

## 2020-01-26 NOTE — ED Provider Notes (Signed)
[facility]   [id number] 01/26/20 Arrival Time: 1309   CC: COVID symptoms  SUBJECTIVE: History from: patient.  Charles Campbell is a 59 y.o. male who presented to the urgent care with a complaint of headache, coughing, nasal congestion, nausea, vomiting and diarrhea 3 days after receiving Covid vaccine.  Received a Covid vaccine last Wednesday.  Denies sick exposure to COVID, flu or strep.  Denies recent travel.  Has tried OTC medication without relief.  Denies alleviating or aggravating factors.  Denies previous symptoms in the past.   Denies fever, chills, fatigue, sinus pain, rhinorrhea, sore throat, SOB, wheezing, chest pain, nausea, changes in bowel or bladder habits.     ROS: As per HPI.  All other pertinent ROS negative.     Past Medical History:  Diagnosis Date  . A-fib ([facility]) 05/2019  . Arthritis    Wrist  . H/O hiatal hernia    watches diet  . Hyperlipidemia    Past Surgical History:  Procedure Laterality Date  . COLONOSCOPY  2009  . FINGER SURGERY     Left index- due to injury  . MULTIPLE EXTRACTIONS WITH ALVEOLOPLASTY  12/24/2011   Procedure: MULTIPLE EXTRACION WITH ALVEOLOPLASTY;  Surgeon: [name], DDS;  Location: [facility] OR;  Service: Oral Surgery;  Laterality: Bilateral;   No Known Allergies No current facility-administered medications on file prior to encounter.   Current Outpatient Medications on File Prior to Encounter  Medication Sig Dispense Refill  . acetaminophen (TYLENOL) 500 MG tablet Take 500 mg by mouth as needed.    Marland Kitchen aspirin EC 81 MG tablet Take 1 tablet (81 mg total) by mouth daily with breakfast. 30 tablet 5  . diltiazem (CARDIZEM CD) 120 MG 24 hr capsule Take 1 capsule (120 mg total) by mouth daily. 30 capsule 4  . meclizine (ANTIVERT) 25 MG tablet Take 1 tablet (25 mg total) by mouth 3 (three) times daily as needed for dizziness. (Patient taking differently: Take 25 mg by mouth as needed for dizziness. ) 30 tablet 0  . Multiple  Vitamins-Minerals (CENTRUM SILVER ULTRA MENS) TABS Take 1 tablet by mouth every morning.     . Omega-3 Fatty Acids (FISH OIL) 1000 MG CAPS Take by mouth daily.      Social History   Socioeconomic History  . Marital status: Divorced    Spouse name: Not on file  . Number of children: 1  . Years of education: 79  . Highest education level: Not on file  Occupational History  . Occupation: Administrator, [employer] Service  Tobacco Use  . Smoking status: Never Smoker  . Smokeless tobacco: Never Used  Vaping Use  . Vaping Use: Never assessed  Substance and Sexual Activity  . Alcohol use: No  . Drug use: No  . Sexual activity: Not Currently  Other Topics Concern  . Not on file  Social History Narrative   Lives alone   One dog, lab   Social Determinants of Health   Financial Resource Strain:   . Difficulty of Paying Living Expenses:   Food Insecurity:   . Worried About Programme researcher, broadcasting/film/video in the Last Year:   . Barista in the Last Year:   Transportation Needs:   . Freight forwarder (Medical):   Marland Kitchen Lack of Transportation (Non-Medical):   Physical Activity:   . Days of Exercise per Week:   . Minutes of Exercise per Session:   Stress:   . Feeling of Stress :  Social Connections:   . Frequency of Communication with Friends and Family:   . Frequency of Social Gatherings with Friends and Family:   . Attends Religious Services:   . Active Member of Clubs or Organizations:   . Attends Banker Meetings:   Marland Kitchen Marital Status:   Intimate Partner Violence:   . Fear of Current or Ex-Partner:   . Emotionally Abused:   Marland Kitchen Physically Abused:   . Sexually Abused:    Family History  Problem Relation Age of Onset  . Cancer Mother        lung  . Asthma Mother   . Heart disease Father 16       heart attack  . Cancer Brother        brain  . Cancer Brother     OBJECTIVE:  Vitals:   01/26/20 1336 01/26/20 1337  BP:  125/65  Pulse:  94  Resp:  17  Temp:  99.1 F (37.3  C)  TempSrc:  Oral  SpO2:  96%  Weight: 222 lb 10.6 oz (101 kg)   Height: 6\' 5"  (1.956 m)      General appearance: alert; appears fatigued, but nontoxic; speaking in full sentences and tolerating own secretions HEENT: NCAT; Ears: EACs clear, TMs pearly gray; Eyes: PERRL.  EOM grossly intact. Sinuses: nontender; Nose: nares patent without rhinorrhea, Throat: oropharynx clear, tonsils non erythematous or enlarged, uvula midline  Neck: supple without LAD Lungs: unlabored respirations, symmetrical air entry; cough: moderate; no respiratory distress; CTAB Heart: regular rate and rhythm.  Radial pulses 2+ symmetrical bilaterally Skin: warm and dry Psychological: alert and cooperative; normal mood and affect  LABS:  No results found for this or any previous visit (from the past 24 hour(s)).   ASSESSMENT & PLAN:  1. Viral gastroenteritis   2. Encounter for screening for COVID-19   3. URI with cough and congestion     Meds ordered this encounter  Medications  . benzonatate (TESSALON) 100 MG capsule    Sig: Take 1 capsule (100 mg total) by mouth every 8 (eight) hours.    Dispense:  30 capsule    Refill:  0  . ondansetron (ZOFRAN-ODT) 4 MG disintegrating tablet    Sig: Take 1 tablet (4 mg total) by mouth every 8 (eight) hours as needed for nausea or vomiting.    Dispense:  30 tablet    Refill:  0  . predniSONE (DELTASONE) 10 MG tablet    Sig: Take 2 tablets (20 mg total) by mouth daily.    Dispense:  15 tablet    Refill:  0    Discharge Instructions.    COVID testing ordered.  It will take between 2-7 days for test results.  Someone will contact you regarding abnormal results.    In the meantime: You should remain isolated in your home for 10 days from symptom onset AND greater than 24 hours after symptoms resolution (absence of fever without the use of fever-reducing medication and improvement in respiratory symptoms), whichever is longer Get plenty of rest and push fluids  Tessalon Perles prescribed for cough Zofran was prescribed for nausea Low-dose prednisone was prescribed Use medications daily for symptom relief Use OTC medications like ibuprofen or tylenol as needed fever or pain Call or go to the ED if you have any new or worsening symptoms such as fever, worsening cough, shortness of breath, chest tightness, chest pain, turning blue, changes in mental status, etc...   Reviewed expectations re: course  of current medical issues. Questions answered. Outlined signs and symptoms indicating need for more acute intervention. Patient verbalized understanding. After Visit Summary given.     Note: This document was prepared using Dragon voice recognition software and may include unintentional dictation errors.     Durward Parcel, FNP 01/26/20 1419

## 2020-01-26 NOTE — Discharge Instructions (Signed)
COVID testing ordered.  It will take between 2-7 days for test results.  Someone will contact you regarding abnormal results.    In the meantime: You should remain isolated in your home for 10 days from symptom onset AND greater than 24 hours after symptoms resolution (absence of fever without the use of fever-reducing medication and improvement in respiratory symptoms), whichever is longer Get plenty of rest and push fluids Tessalon Perles prescribed for cough Zofran was prescribed for nausea Low-dose prednisone was prescribed Use medications daily for symptom relief Use OTC medications like ibuprofen or tylenol as needed fever or pain Call or go to the ED if you have any new or worsening symptoms such as fever, worsening cough, shortness of breath, chest tightness, chest pain, turning blue, changes in mental status, etc..Marland Kitchen

## 2020-01-28 LAB — NOVEL CORONAVIRUS, NAA: SARS-CoV-2, NAA: DETECTED — AB

## 2020-01-28 LAB — SARS-COV-2, NAA 2 DAY TAT

## 2020-02-03 ENCOUNTER — Ambulatory Visit: Payer: BC Managed Care – PPO | Admitting: Nurse Practitioner

## 2020-02-10 ENCOUNTER — Encounter: Payer: Self-pay | Admitting: Cardiology

## 2020-02-11 ENCOUNTER — Other Ambulatory Visit: Payer: Self-pay

## 2020-02-11 ENCOUNTER — Emergency Department (HOSPITAL_COMMUNITY)
Admission: EM | Admit: 2020-02-11 | Discharge: 2020-02-11 | Disposition: A | Discharge status: Home / Self Care | Payer: BC Managed Care – PPO | Attending: Emergency Medicine | Admitting: Emergency Medicine

## 2020-02-11 ENCOUNTER — Emergency Department (HOSPITAL_COMMUNITY): Payer: BC Managed Care – PPO

## 2020-02-11 ENCOUNTER — Encounter (HOSPITAL_COMMUNITY): Payer: Self-pay

## 2020-02-11 DIAGNOSIS — R109 Unspecified abdominal pain: Secondary | ICD-10-CM | POA: Diagnosis not present

## 2020-02-11 DIAGNOSIS — T148XXA Other injury of unspecified body region, initial encounter: Secondary | ICD-10-CM

## 2020-02-11 DIAGNOSIS — Z8616 Personal history of COVID-19: Secondary | ICD-10-CM | POA: Diagnosis not present

## 2020-02-11 DIAGNOSIS — I700 Atherosclerosis of aorta: Secondary | ICD-10-CM | POA: Diagnosis not present

## 2020-02-11 DIAGNOSIS — Y9301 Activity, walking, marching and hiking: Secondary | ICD-10-CM | POA: Insufficient documentation

## 2020-02-11 DIAGNOSIS — Y999 Unspecified external cause status: Secondary | ICD-10-CM | POA: Insufficient documentation

## 2020-02-11 DIAGNOSIS — S39012A Strain of muscle, fascia and tendon of lower back, initial encounter: Secondary | ICD-10-CM | POA: Insufficient documentation

## 2020-02-11 DIAGNOSIS — Y92810 Car as the place of occurrence of the external cause: Secondary | ICD-10-CM | POA: Insufficient documentation

## 2020-02-11 DIAGNOSIS — S3992XA Unspecified injury of lower back, initial encounter: Secondary | ICD-10-CM | POA: Diagnosis not present

## 2020-02-11 DIAGNOSIS — Z79899 Other long term (current) drug therapy: Secondary | ICD-10-CM | POA: Insufficient documentation

## 2020-02-11 DIAGNOSIS — X58XXXA Exposure to other specified factors, initial encounter: Secondary | ICD-10-CM | POA: Diagnosis not present

## 2020-02-11 LAB — URINALYSIS, ROUTINE W REFLEX MICROSCOPIC
Bilirubin Urine: NEGATIVE
Glucose, UA: NEGATIVE mg/dL
Hgb urine dipstick: NEGATIVE
Ketones, ur: NEGATIVE mg/dL
Leukocytes,Ua: NEGATIVE
Nitrite: NEGATIVE
Protein, ur: NEGATIVE mg/dL
Specific Gravity, Urine: 1.015 (ref 1.005–1.030)
pH: 6 (ref 5.0–8.0)

## 2020-02-11 MED ORDER — LORAZEPAM 1 MG PO TABS
2.0000 mg | ORAL_TABLET | Freq: Once | ORAL | Status: AC
  Administered 2020-02-11: 2 mg via ORAL
  Filled 2020-02-11: qty 2

## 2020-02-11 MED ORDER — ACETAMINOPHEN 500 MG PO TABS
1000.0000 mg | ORAL_TABLET | Freq: Once | ORAL | Status: AC
  Administered 2020-02-11: 1000 mg via ORAL
  Filled 2020-02-11: qty 2

## 2020-02-11 NOTE — ED Notes (Signed)
Pt still a slight bit drowsy. Pt brother here to pick pt up. Discharge instructions reviewed with patient, Pt verbalized understanding. Pt ambulatory to brother's truck.

## 2020-02-11 NOTE — ED Notes (Signed)
Pt ambulatory to [facility]. Pt a little unsteady on his feet and required assistance walking. Pt states he is still drowsy.

## 2020-02-11 NOTE — ED Notes (Addendum)
Pt states he was not able to get in touch with his brother to come get him. Informed pt if he can not get a ride he will have to wait to he discharged until awake and medication wears off. Pt verbalized understanding.

## 2020-02-11 NOTE — ED Provider Notes (Signed)
[facility]ERGENCY DEPARTMENT Provider Note   CSN: [id number] Arrival date & time: 02/11/20  [id number]     History Chief Complaint  Patient presents with  . Back Pain    Charles Campbell is a 59 y.o. male presenting with sudden onset right lower back pain that started when he was at work this morning.  Patient reports he was walking at work when all of a sudden he started having pain in the area.  Patient reports that the pain was so bad that his boss had to help him out to the car.  He also reports that he barely made it from the car to the emergency room.  He reports that the pain has improved a little bit since getting here.  Patient denies any history of trauma or recent injury.  He reports having normal stool this morning.  He does report history of hiatal hernia but no abdominal surgeries.  He denies any fever, chills, nausea, vomiting, diarrhea, constipation, dysuria.     Past Medical History:  Diagnosis Date  . A-fib (HCC) 05/2019  . Arthritis    Wrist  . H/O hiatal hernia    watches diet  . Hyperlipidemia     Patient Active Problem List   Diagnosis Date Noted  . Callus of foot 10/23/2019  . Hammer toes, bilateral 10/23/2019  . Tailor's bunion of left foot 10/23/2019  . Acute prerenal azotemia 06/05/2019  . HLD (hyperlipidemia) 06/05/2019  . Osteoarthritis 06/05/2019  . Atrial fibrillation with RVR (HCC) 06/04/2019  . Vertigo 11/28/2017  . Intractable nausea and vomiting 11/28/2017  . Syncope and collapse 07/02/2016  . Environmental allergies 07/02/2016    Past Surgical History:  Procedure Laterality Date  . COLONOSCOPY  2009  . FINGER SURGERY     Left index- due to injury  . MULTIPLE EXTRACTIONS WITH ALVEOLOPLASTY  12/24/2011   Procedure: MULTIPLE EXTRACION WITH ALVEOLOPLASTY;  Surgeon: Georgia LopesScott M Jensen, DDS;  Location: [facility] OR;  Service: Oral Surgery;  Laterality: Bilateral;       Family History  Problem Relation Age of Onset  . Cancer Mother        lung  .  Asthma Mother   . Heart disease Father [id number]       heart attack  . Cancer Brother        brain  . Cancer Brother     Social History   Tobacco Use  . Smoking status: Never Smoker  . Smokeless tobacco: Never Used  Vaping Use  . Vaping Use: Never assessed  Substance Use Topics  . Alcohol use: No  . Drug use: No    Home Medications Prior to Admission medications   Medication Sig Start Date End Date Taking? Authorizing Provider  acetaminophen (TYLENOL) 500 MG tablet Take 500 mg by mouth as needed.    [provider]  aspirin EC 81 MG tablet Take 1 tablet (81 mg total) by mouth daily with breakfast. 06/05/19   Emokpae, Courage, MD  benzonatate (TESSALON) 100 MG capsule Take 1 capsule (100 mg total) by mouth every 8 (eight) hours. 01/26/20   Avegno, Zachery DakinsKomlanvi S, FNP  diltiazem (CARDIZEM CD) 120 MG 24 hr capsule Take 1 capsule (120 mg total) by mouth daily. 06/05/19 06/04/20  Shon HaleEmokpae, Courage, MD  meclizine (ANTIVERT) 25 MG tablet Take 1 tablet (25 mg total) by mouth 3 (three) times daily as needed for dizziness. Patient taking differently: Take 25 mg by mouth as needed for dizziness.  08/05/19  Bates, Crystal A, FNP  Multiple Vitamins-Minerals (CENTRUM SILVER ULTRA MENS) TABS Take 1 tablet by mouth every morning.     [provider]  Omega-3 Fatty Acids (FISH OIL) 1000 MG CAPS Take by mouth daily.     [provider]  ondansetron (ZOFRAN-ODT) 4 MG disintegrating tablet Take 1 tablet (4 mg total) by mouth every 8 (eight) hours as needed for nausea or vomiting. 01/26/20   Avegno, Zachery Dakins, FNP  predniSONE (DELTASONE) 10 MG tablet Take 2 tablets (20 mg total) by mouth daily. 01/26/20   Avegno, Zachery Dakins, FNP    Allergies    Patient has no known allergies.  Review of Systems   Review of Systems  Constitutional: Positive for activity change. Negative for diaphoresis, fatigue and fever.  HENT: Negative.   Eyes: Negative.   Respiratory: Negative.   Cardiovascular:  Negative for chest pain, palpitations and leg swelling.  Gastrointestinal: Negative.  Negative for abdominal distention, abdominal pain, blood in stool, constipation, diarrhea, rectal pain and vomiting.  Endocrine: Negative.   Genitourinary: Negative.  Negative for difficulty urinating, hematuria and testicular pain.  Musculoskeletal: Positive for back pain and myalgias. Negative for gait problem, joint swelling, neck pain and neck stiffness.  Skin: Negative for rash and wound.  Allergic/Immunologic: Negative.   Neurological: Negative for dizziness, syncope, facial asymmetry and headaches.  Hematological: Negative.   Psychiatric/Behavioral: Negative.     Physical Exam Updated Vital Signs BP 137/83 (BP Location: Right Arm)   Pulse 77   Temp 97.9 F (36.6 C) (Oral)   Resp 16   Ht 6\' 5"  (1.956 m)   Wt 102.1 kg   SpO2 97%   BMI 26.68 kg/m   Physical Exam Vitals and nursing note reviewed.  Constitutional:      General: He is not in acute distress.    Appearance: Normal appearance. He is not ill-appearing, toxic-appearing or diaphoretic.  HENT:     Head: Normocephalic and atraumatic.     Nose: Nose normal.     Mouth/Throat:     Mouth: Mucous membranes are moist.  Eyes:     Pupils: Pupils are equal, round, and reactive to light.  Cardiovascular:     Rate and Rhythm: Normal rate and regular rhythm.  Pulmonary:     Effort: Pulmonary effort is normal.  Abdominal:     General: Abdomen is flat. Bowel sounds are normal. There is no distension.     Palpations: Abdomen is soft.     Tenderness: There is no abdominal tenderness. There is no right CVA tenderness, left CVA tenderness or guarding.  Musculoskeletal:     Cervical back: Normal, normal range of motion and neck supple.     Thoracic back: Normal.     Lumbar back: Tenderness present. No swelling, deformity, lacerations or bony tenderness. Normal range of motion. Negative right straight leg raise test and negative left straight  leg raise test.       Back:     Comments: No obvious echchymoses, erythema to back. No deformities appreciated on back. Patient has tenderness to palpation that is localized to muscular right lower back.  He does not have any bony tenderness of his lumbar spine, thoracic spine, hips.  He has normal range of motion of his hips and lower extremities. Patient has normal ROM of torso. Only mild pain to localized area of right lower back with later flexion to the left. His straight leg raise is negative.    Lymphadenopathy:  Cervical: No cervical adenopathy.  Skin:    General: Skin is warm.     Capillary Refill: Capillary refill takes less than 2 seconds.     Findings: No bruising.  Neurological:     General: No focal deficit present.     Mental Status: He is alert and oriented to person, place, and time. Mental status is at baseline.     Cranial Nerves: No cranial nerve deficit.     Sensory: No sensory deficit.     Motor: No weakness.     Coordination: Coordination normal.     Gait: Gait normal.     Deep Tendon Reflexes: Reflexes normal.  Psychiatric:        Mood and Affect: Mood normal.        Behavior: Behavior normal.        Thought Content: Thought content normal.        Judgment: Judgment normal.     ED Results / Procedures / Treatments   Labs (all labs ordered are listed, but only abnormal results are displayed) Labs Reviewed  URINALYSIS, ROUTINE W REFLEX MICROSCOPIC    EKG None  Radiology CT Renal Stone Study  Result Date: 02/11/2020 CLINICAL DATA:  Right flank pain EXAM: CT ABDOMEN AND PELVIS WITHOUT CONTRAST TECHNIQUE: Multidetector CT imaging of the abdomen and pelvis was performed following the standard protocol without IV contrast. COMPARISON:  None. FINDINGS: Lower chest: Patchy bilateral pulmonary infiltrates with a pattern often seen with viral COVID pneumonia. No dense consolidation or lobar collapse. Hepatobiliary: Normal Pancreas: Normal Spleen: Normal  Adrenals/Urinary Tract: Adrenal glands are normal. Kidneys are normal. No cyst, mass, stone or hydronephrosis. Bladder is normal. Stomach/Bowel: Normal Vascular/Lymphatic: Aortic atherosclerosis. No aneurysm. IVC is normal. No adenopathy. Reproductive: Normal Other: No free fluid or air. Musculoskeletal: Ordinary lower lumbar degenerative changes. IMPRESSION: 1. Patchy bilateral pulmonary infiltrates with a pattern often seen with viral COVID pneumonia. No dense consolidation or lobar collapse. 2. No evidence of urinary tract stone disease or other urinary tract pathology. 3. Aortic atherosclerosis. Aortic Atherosclerosis (ICD10-I70.0). Electronically Signed   By: Paulina Fusi M.D.   On: 02/11/2020 12:42    Procedures Procedures (including critical care time)  Medications Ordered in ED Medications  acetaminophen (TYLENOL) tablet 1,000 mg (1,000 mg Oral Given 02/11/20 1149)  LORazepam (ATIVAN) tablet 2 mg (2 mg Oral Given 02/11/20 1211)    ED Course  I have reviewed the triage vital signs and the nursing notes.  Pertinent labs & imaging results that were available during my care of the patient were reviewed by me and considered in my medical decision making (see chart for details).    MDM Rules/Calculators/A&P                          This is a 59 year old male with past medical history significant for A. Fib (not on anticoagulation), osteoarthritis who presents today with sudden onset right lower back pain.  Patient reports that he was walking when the pain started.  Patient reports intense 10 out of 10 pain that required assistance getting to his car.  He reports the pain continued when he came to the emergency department.  His pain has improved slightly since then.  On physical exam, patient's vital signs are within normal limits.  His physical exam is significant for muscular tenderness to localized areas on his right lower back.  There are muscular spasms appreciated with palpation.  He does  not have  any bony tenderness to his thoracic or lumbar spine.  There is no gross abnormality of his back.  Lower extremity, hip, torso range of motion is within normal limits with negative straight leg raise bilaterally.   CT renal stone study is negative and ruled out acute appendicitis.  Physical exam and history are consistent with lower muscular strain.  Patient does report that he was using his back yesterday at work and feels like he might have overdone it.  Patient reports that he has been having trouble reaching his primary care provider.  He is agreeable go to the sports medicine center in [place].  Will provide patient information for sports medicine center office.  Patient provided instructions for rest and to follow-up with sports medicine at his earliest convenience.  He can use tylenol and ibuprofen for pain (not on anticoagulation for hx of a fib).    Final Clinical Impression(s) / ED Diagnoses Final diagnoses:  History of COVID-19  Flank pain  Muscle strain    Rx / DC Orders ED Discharge Orders    None       Melene Plan, MD 02/11/20 1415    Blane Ohara, MD 02/11/20 1527

## 2020-02-11 NOTE — ED Triage Notes (Signed)
Pt presents to ED with complaints of right lower back pain radiates to right lower abdomen. Pt denies N/V/D. Pt denies urinary symptoms.

## 2020-02-11 NOTE — ED Notes (Signed)
Pt returned from CT. Pt refused CT exam.

## 2020-02-11 NOTE — ED Notes (Signed)
Pt transported to CT ?

## 2020-02-11 NOTE — ED Notes (Signed)
Pt back from CT

## 2020-02-11 NOTE — Discharge Instructions (Signed)
Use Tylenol and ibuprofen as needed for pain. Follow-up with primary care doctor and sports medicine as discussed.

## 2020-02-11 NOTE — ED Notes (Signed)
Pt states he is still drowsy. Pt given crackers and drink.

## 2020-03-23 ENCOUNTER — Ambulatory Visit: Payer: BC Managed Care – PPO | Admitting: Cardiology

## 2022-05-05 IMAGING — CT CT RENAL STONE PROTOCOL
2 of 3 series · 17 of 46 positions shown, 19 images · non-contrast
Comparison: None.

CLINICAL DATA: Right flank pain

EXAM:
CT ABDOMEN AND PELVIS WITHOUT CONTRAST
TECHNIQUE: Multidetector CT imaging of the abdomen and pelvis was performed
following the standard protocol without IV contrast.

[Series 2: axial st · axial · 0.77mm/px · z∈[+844,+1289]mm · 14 of 103 slices shown, 16 images]
[im 7/103  soft-tissue]
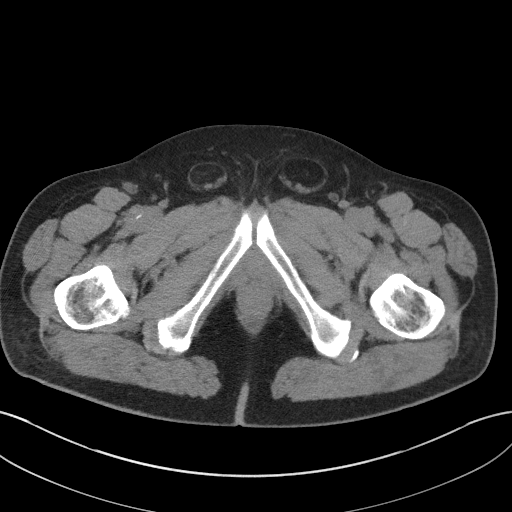
[im 7/103  bone]
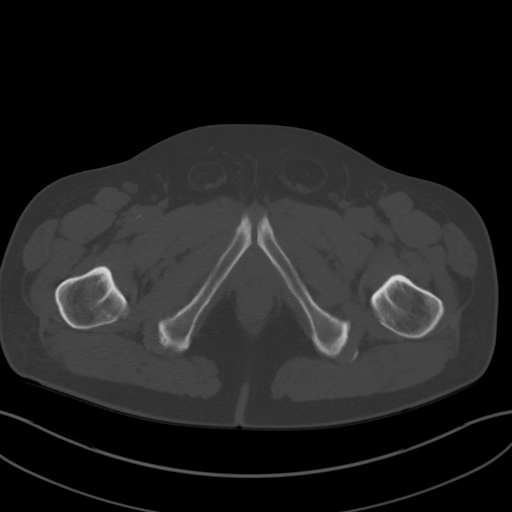
[im 14/103  soft-tissue]
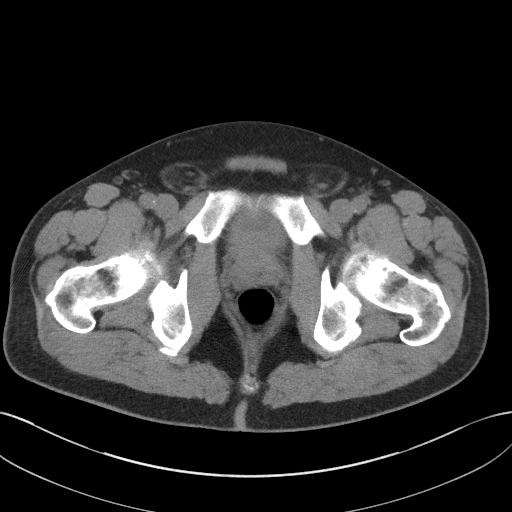
[im 20/103  soft-tissue]
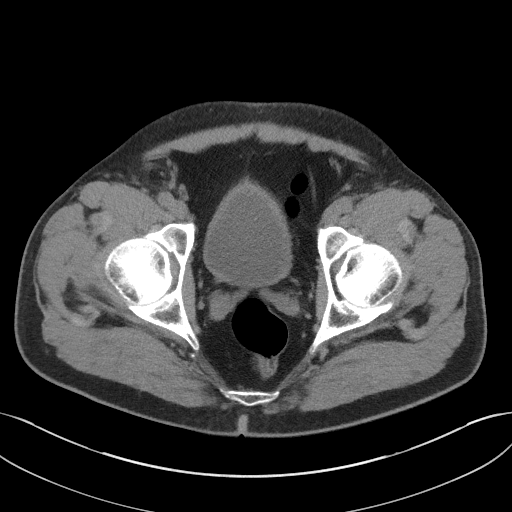
[im 27/103  soft-tissue]
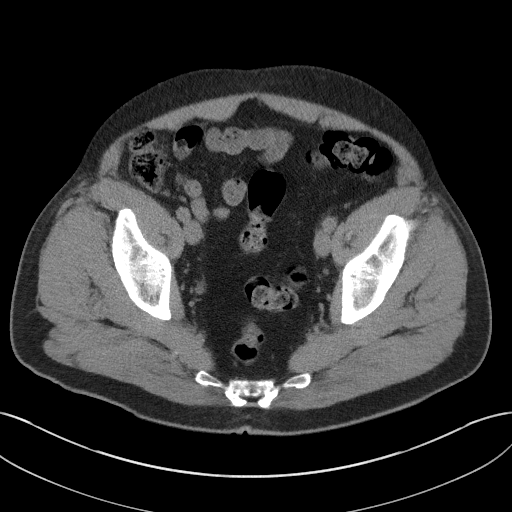
[im 33/103  soft-tissue]
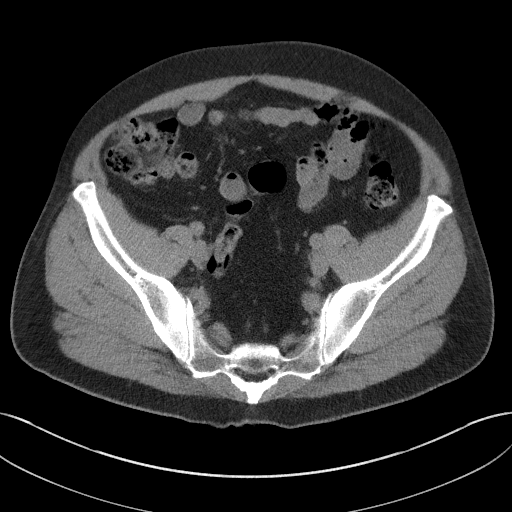
[im 40/103  soft-tissue]
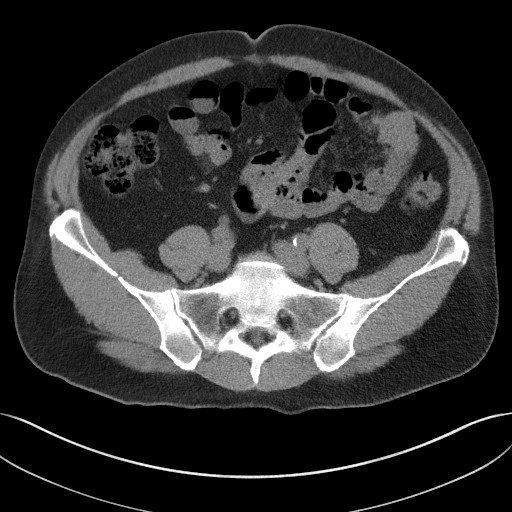
[im 47/103  soft-tissue]
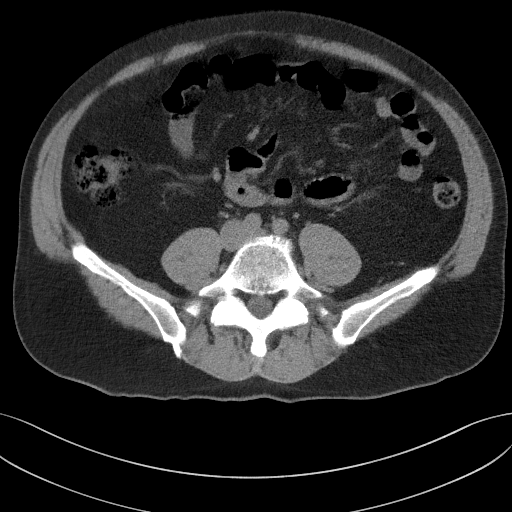
[im 56/103  soft-tissue]
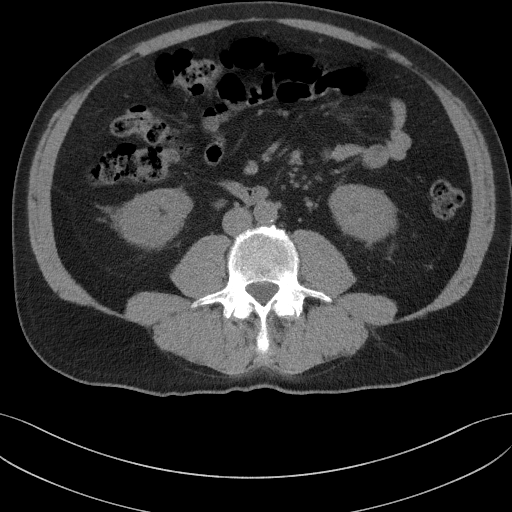
[im 63/103  soft-tissue]
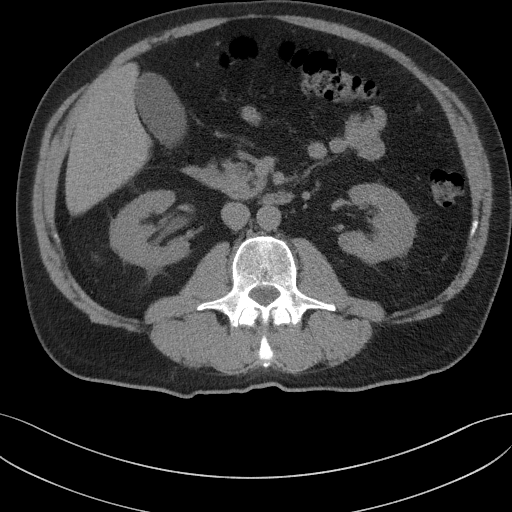
[im 63/103  bone]
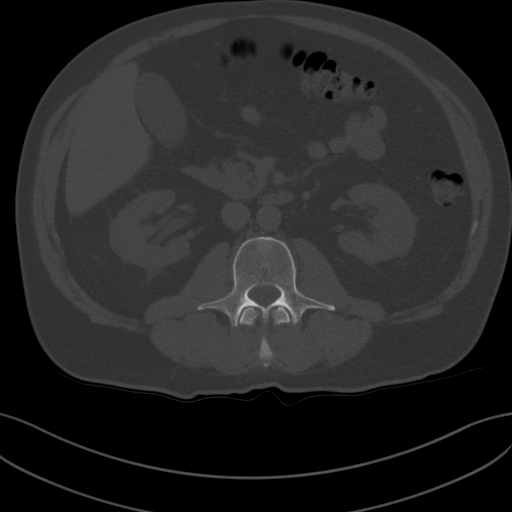
[im 70/103  soft-tissue]
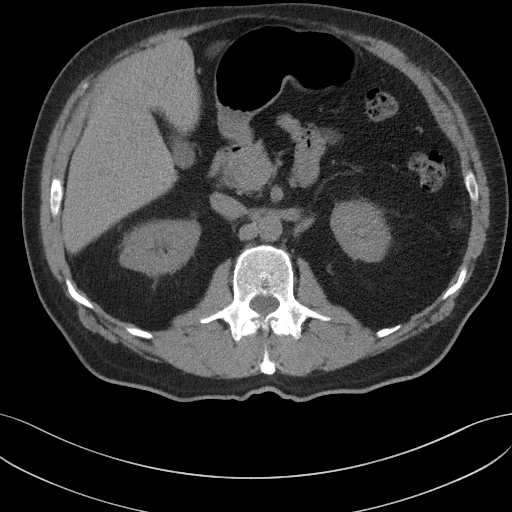
[im 76/103  soft-tissue]
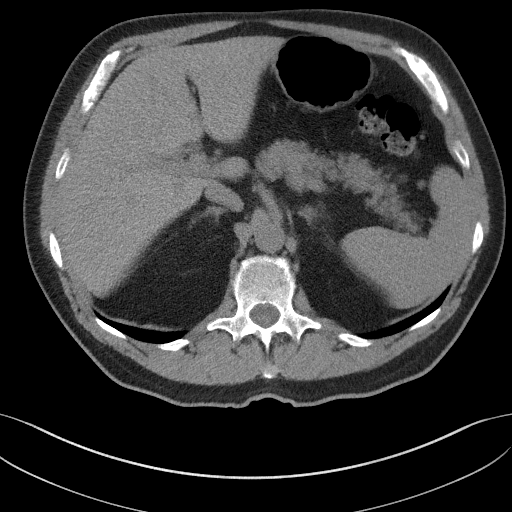
[im 83/103  soft-tissue]
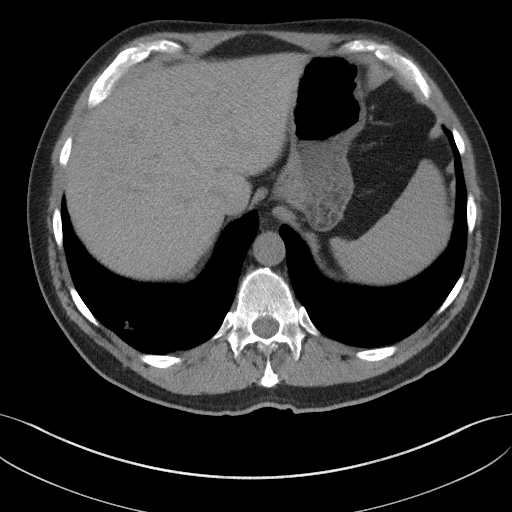
[im 89/103  soft-tissue]
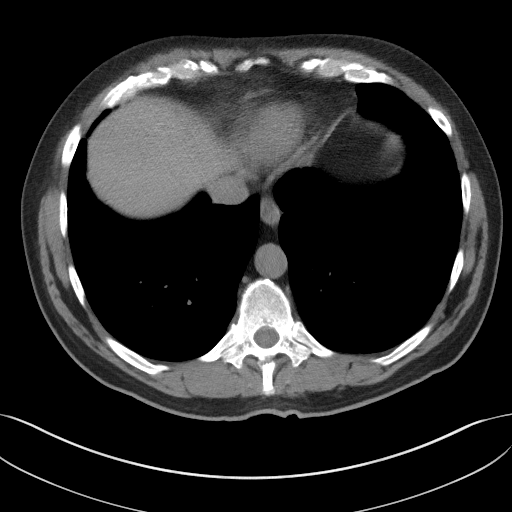
[im 96/103  soft-tissue]
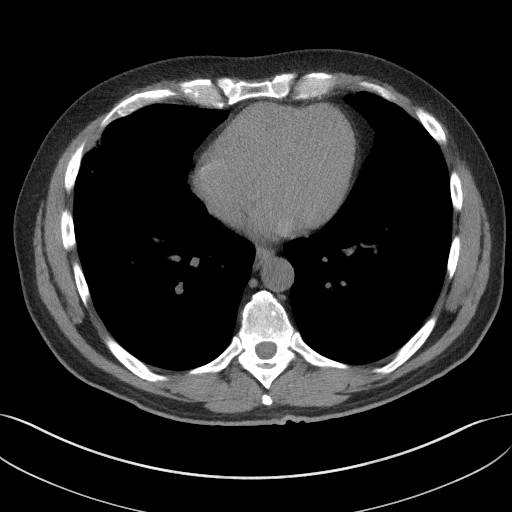

[Series 5: coronal st · coronal · 0.86mm/px · 3 of 113 slices shown]
[im 38/113  soft-tissue]
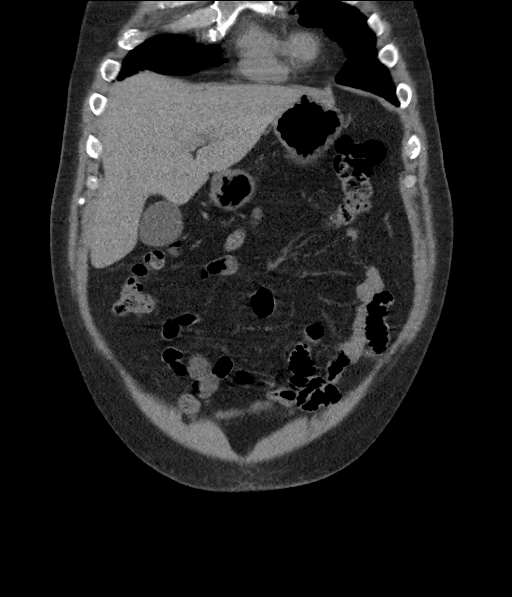
[im 50/113  soft-tissue]
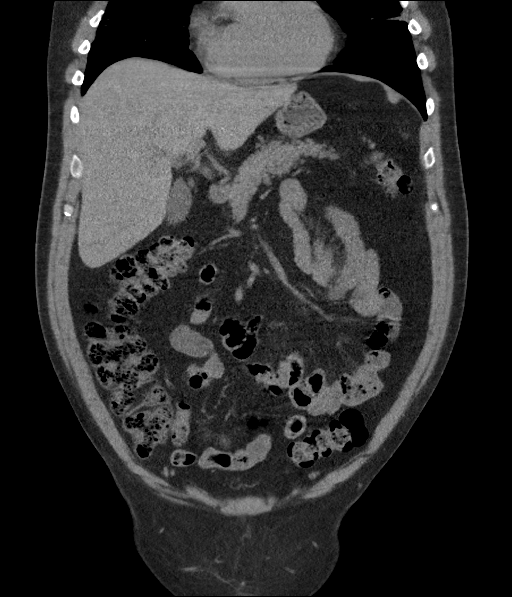
[im 63/113  soft-tissue]
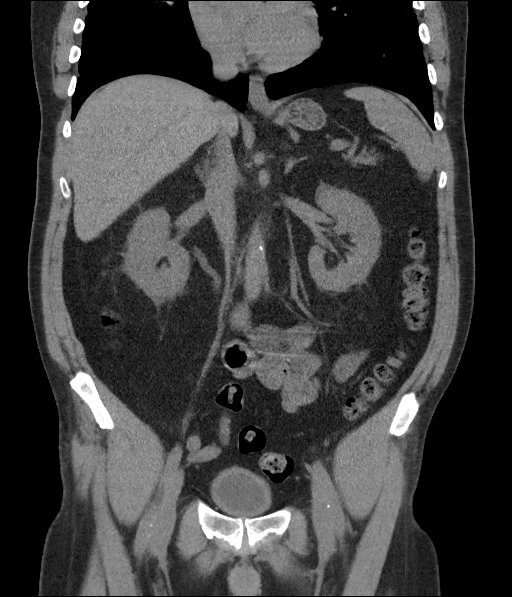

[17 of 46 positions shown; findings below may reference images not displayed]

FINDINGS: Lower chest: Patchy bilateral pulmonary infiltrates with a pattern
often seen with viral COVID pneumonia. No dense consolidation or
lobar collapse.

Hepatobiliary: Normal

Pancreas: Normal

Spleen: Normal

Adrenals/Urinary Tract: Adrenal glands are normal. Kidneys are
normal. No cyst, mass, stone or hydronephrosis. Bladder is normal.

Stomach/Bowel: Normal

Vascular/Lymphatic: Aortic atherosclerosis. No aneurysm. IVC is
normal. No adenopathy.

Reproductive: Normal

Other: No free fluid or air.

Musculoskeletal: Ordinary lower lumbar degenerative changes.
IMPRESSION: 1. Patchy bilateral pulmonary infiltrates with a pattern often seen
with viral COVID pneumonia. No dense consolidation or lobar
collapse.
2. No evidence of urinary tract stone disease or other urinary tract
pathology.
3. Aortic atherosclerosis.

Aortic Atherosclerosis ([id number]).

## 2022-08-17 DIAGNOSIS — Z419 Encounter for procedure for purposes other than remedying health state, unspecified: Secondary | ICD-10-CM | POA: Diagnosis not present

## 2022-08-20 ENCOUNTER — Other Ambulatory Visit: Payer: Self-pay

## 2022-08-20 ENCOUNTER — Emergency Department (HOSPITAL_COMMUNITY)
Admission: EM | Admit: 2022-08-20 | Discharge: 2022-08-20 | Disposition: A | Discharge status: Home / Self Care | Payer: Medicaid Other | Attending: Emergency Medicine | Admitting: Emergency Medicine

## 2022-08-20 ENCOUNTER — Encounter (HOSPITAL_COMMUNITY): Payer: Self-pay | Admitting: *Deleted

## 2022-08-20 ENCOUNTER — Emergency Department (HOSPITAL_COMMUNITY): Payer: Medicaid Other

## 2022-08-20 ENCOUNTER — Ambulatory Visit
Admission: EM | Admit: 2022-08-20 | Discharge: 2022-08-20 | Disposition: A | Discharge status: Other Acute Inpatient Hospital | Payer: Medicaid Other | Attending: Nurse Practitioner | Admitting: Nurse Practitioner

## 2022-08-20 DIAGNOSIS — Z7982 Long term (current) use of aspirin: Secondary | ICD-10-CM | POA: Insufficient documentation

## 2022-08-20 DIAGNOSIS — R9431 Abnormal electrocardiogram [ECG] [EKG]: Secondary | ICD-10-CM

## 2022-08-20 DIAGNOSIS — R0602 Shortness of breath: Secondary | ICD-10-CM

## 2022-08-20 DIAGNOSIS — R110 Nausea: Secondary | ICD-10-CM | POA: Diagnosis not present

## 2022-08-20 DIAGNOSIS — Z9989 Dependence on other enabling machines and devices: Secondary | ICD-10-CM | POA: Diagnosis not present

## 2022-08-20 DIAGNOSIS — R079 Chest pain, unspecified: Secondary | ICD-10-CM | POA: Diagnosis not present

## 2022-08-20 DIAGNOSIS — G4489 Other headache syndrome: Secondary | ICD-10-CM | POA: Diagnosis not present

## 2022-08-20 DIAGNOSIS — R0789 Other chest pain: Secondary | ICD-10-CM | POA: Diagnosis not present

## 2022-08-20 LAB — BASIC METABOLIC PANEL
Anion gap: 11 (ref 5–15)
BUN: 19 mg/dL (ref 8–23)
CO2: 20 mmol/L — ABNORMAL LOW (ref 22–32)
Calcium: 8.9 mg/dL (ref 8.9–10.3)
Chloride: 102 mmol/L (ref 98–111)
Creatinine, Ser: 1.22 mg/dL (ref 0.61–1.24)
GFR, Estimated: >60 mL/min (ref >60)
Glucose, Bld: 107 mg/dL — ABNORMAL HIGH (ref 70–99)
Potassium: 3.4 mmol/L — ABNORMAL LOW (ref 3.5–5.1)
Sodium: 133 mmol/L — ABNORMAL LOW (ref 135–145)

## 2022-08-20 LAB — CBC
HCT: 46.8 % (ref 39.0–52.0)
Hemoglobin: 16.1 g/dL (ref 13.0–17.0)
MCH: 30 pg (ref 26.0–34.0)
MCHC: 34.4 g/dL (ref 30.0–36.0)
MCV: 87.2 fL (ref 80.0–100.0)
Platelets: 255 10*3/uL (ref 150–400)
RBC: 5.37 MIL/uL (ref 4.22–5.81)
RDW: 13.2 % (ref 11.5–15.5)
WBC: 7.3 10*3/uL (ref 4.0–10.5)
nRBC: 0 % (ref 0.0–0.2)

## 2022-08-20 LAB — TROPONIN I (HIGH SENSITIVITY)
Troponin I (High Sensitivity): 7 ng/L (ref <18)
Troponin I (High Sensitivity): 8 ng/L (ref <18)

## 2022-08-20 MED ORDER — ONDANSETRON HCL 4 MG/2ML IJ SOLN
4.0000 mg | Freq: Once | INTRAMUSCULAR | Status: AC
  Administered 2022-08-20: 4 mg via INTRAVENOUS
  Filled 2022-08-20: qty 2

## 2022-08-20 MED ORDER — NITROGLYCERIN 0.4 MG SL SUBL
0.4000 mg | SUBLINGUAL_TABLET | Freq: Once | SUBLINGUAL | Status: AC
  Administered 2022-08-20: 0.4 mg via SUBLINGUAL
  Filled 2022-08-20: qty 1

## 2022-08-20 NOTE — ED Triage Notes (Signed)
Pt comes here with SOB, chest pain that feels like tightness, with sweating and nausea that started yesterday.

## 2022-08-20 NOTE — ED Provider Notes (Signed)
[facility]    CSN: [id number] Arrival date & time: 08/20/22  1248      History   Chief Complaint Chief Complaint  Patient presents with   Shortness of Breath   Chest Pain    HPI Charles Campbell is a 62 y.o. male.   Patient presents today for for 1 day history of shortness of breath, left-sided chest tightness that radiates over to the right right side, sweating, and nausea.  He reports his nose is also been running.  No significant cough.  Also endorses a feeling like he is going to pass out when walking.  Patient reports a history of atrial fibrillation, hyperlipidemia.  No history of myocardial infarction.  Does not currently have a primary care provider.    Past Medical History:  Diagnosis Date   A-fib ([place]) 05/2019   Arthritis    Wrist   H/O hiatal hernia    watches diet   Hyperlipidemia     Patient Active Problem List   Diagnosis Date Noted   Callus of foot 10/23/2019   Hammer toes, bilateral 10/23/2019   Tailor's bunion of left foot 10/23/2019   Acute prerenal azotemia 06/05/2019   HLD (hyperlipidemia) 06/05/2019   Osteoarthritis 06/05/2019   Atrial fibrillation with RVR ([place]) 06/04/2019   Vertigo 11/28/2017   Intractable nausea and vomiting 11/28/2017   Syncope and collapse 07/02/2016   Environmental allergies 07/02/2016    Past Surgical History:  Procedure Laterality Date   COLONOSCOPY  2009   FINGER SURGERY     Left index- due to injury   MULTIPLE EXTRACTIONS WITH ALVEOLOPLASTY  12/24/2011   Procedure: MULTIPLE EXTRACION WITH ALVEOLOPLASTY;  Surgeon: Gae Bon, DDS;  Location: [place];  Service: Oral Surgery;  Laterality: Bilateral;       Home Medications    Prior to Admission medications   Medication Sig Start Date End Date Taking? Authorizing Provider  acetaminophen (TYLENOL) 500 MG tablet Take 500 mg by mouth as needed.    [provider]  aspirin EC 81 MG tablet Take 1 tablet (81 mg total) by mouth daily with  breakfast. 06/05/19   Emokpae, Courage, MD  benzonatate (TESSALON) 100 MG capsule Take 1 capsule (100 mg total) by mouth every 8 (eight) hours. 01/26/20   Avegno, Darrelyn Hillock, FNP  diltiazem (CARDIZEM CD) 120 MG 24 hr capsule Take 1 capsule (120 mg total) by mouth daily. 06/05/19 06/04/20  Roxan Hockey, MD  meclizine (ANTIVERT) 25 MG tablet Take 1 tablet (25 mg total) by mouth 3 (three) times daily as needed for dizziness. Patient taking differently: Take 25 mg by mouth as needed for dizziness.  08/05/19   Annie Main, FNP  Multiple Vitamins-Minerals (CENTRUM SILVER ULTRA MENS) TABS Take 1 tablet by mouth every morning.     [provider]  Omega-3 Fatty Acids (FISH OIL) 1000 MG CAPS Take by mouth daily.     [provider]  ondansetron (ZOFRAN-ODT) 4 MG disintegrating tablet Take 1 tablet (4 mg total) by mouth every 8 (eight) hours as needed for nausea or vomiting. 01/26/20   Avegno, Darrelyn Hillock, FNP  predniSONE (DELTASONE) 10 MG tablet Take 2 tablets (20 mg total) by mouth daily. 01/26/20   AvegnoDarrelyn Hillock, FNP    Family History Family History  Problem Relation Age of Onset   Cancer Mother        lung   Asthma Mother    Heart disease Father 18  heart attack   Cancer Brother        brain   Cancer Brother     Social History Social History   Tobacco Use   Smoking status: Never   Smokeless tobacco: Never  Substance Use Topics   Alcohol use: No   Drug use: No     Allergies   Patient has no known allergies.   Review of Systems Review of Systems Per HPI  Physical Exam Triage Vital Signs ED Triage Vitals [08/20/22 1319]  Enc Vitals Group     BP (!) 162/76     Pulse Rate 80     Resp 20     Temp 98 F (36.7 C)     Temp Source Oral     SpO2 98 %     Weight      Height      Head Circumference      Peak Flow      Pain Score      Pain Loc      Pain Edu?      Excl. in [place]?    No data found.  Updated Vital Signs BP (!) 162/76 (BP  Location: Right Arm)   Pulse 80   Temp 98 F (36.7 C) (Oral)   Resp 20   SpO2 98%   Visual Acuity Right Eye Distance:   Left Eye Distance:   Bilateral Distance:    Right Eye Near:   Left Eye Near:    Bilateral Near:     Physical Exam Vitals and nursing note reviewed.  Constitutional:      General: He is not in acute distress.    Appearance: He is well-developed. He is diaphoretic. He is not toxic-appearing.  HENT:     Head: Normocephalic and atraumatic.     Mouth/Throat:     Mouth: Mucous membranes are moist.  Cardiovascular:     Rate and Rhythm: Normal rate and regular rhythm.  Pulmonary:     Effort: Tachypnea present.     Breath sounds: No decreased breath sounds.  Chest:     Chest wall: No mass or tenderness.  Musculoskeletal:     Cervical back: Normal range of motion.  Skin:    General: Skin is warm.     Coloration: Skin is not cyanotic or pale.  Neurological:     Mental Status: He is alert and oriented to person, place, and time.  Psychiatric:        Behavior: Behavior is cooperative.      UC Treatments / Results  Labs (all labs ordered are listed, but only abnormal results are displayed) Labs Reviewed - No data to display  EKG   Radiology No results found.  Procedures Procedures (including critical care time)  Medications Ordered in UC Medications - No data to display  Initial Impression / Assessment and Plan / UC Course  I have reviewed the triage vital signs and the nursing notes.  Pertinent labs & imaging results that were available during my care of the patient were reviewed by me and considered in my medical decision making (see chart for details).  Initially in triage, patient is tachypneic, ill-appearing.  He is mildly hypertensive in triage, afebrile, not tachycardic, and extending well on room air.   1. Chest tightness 2. Shortness of breath 3. Abnormal EKG EKG today reveals changes in multiple leads; I am concerned for possible  anterior infarction Given current chest pain/tightness, shortness of breath, tachypnea, I recommended transportation to nearest  emergency room via EMS Patient is in agreement to plan EMS activated Patient transported to emergency room via EMS; departed from urgent care in stable condition  The patient was given the opportunity to ask questions.  All questions answered to their satisfaction.  The patient is in agreement to this plan.    Final Clinical Impressions(s) / UC Diagnoses   Final diagnoses:  Chest tightness  Shortness of breath  Abnormal EKG   Discharge Instructions   None    ED Prescriptions   None    PDMP not reviewed this encounter.   Eulogio Bear, NP 08/20/22 1326

## 2022-08-20 NOTE — Discharge Instructions (Signed)
Your tests here today are reassuring, as discussed your EKG, chest x-ray and blood tests are all normal with no evidence of your symptoms being from a cardiac source.  However I do feel you would benefit from an outpatient cardiology follow-up to consider having a repeat exercise treadmill test to confirm cardiac health.  Our local cardiologist should be calling you to arrange an office visit to discuss this.  In the meantime return here if you have any new or return of symptoms.

## 2022-08-20 NOTE — ED Provider Notes (Cosign Needed Addendum)
[facility] Provider Note   CSN: [id number] Arrival date & time: 08/20/22  1346     History  Chief Complaint  Patient presents with   Chest Pain    Charles Campbell is a 62 y.o. male with a history including proximal A-fib, hyperlipidemia history of syncope and vertigo presenting with a 1 day history of sob and chest pain which started today, episodes of diaphoresis and nausea.  He describes feeling a little bit winded yesterday with exertion, today he developed chest pain with shortness of breath, describes as sharp pain in his left and right chest, stating the pain alternates from side-to-side.  He is not short of breath at rest or when supine, but with exertion only.  He was seen at an urgent care center and was sent here per EMS, enroute he received 3 baby aspirin and 1 nitroglycerin tablet which he states helped but did not resolve his chest pain, but seem to cause nausea, he describes dry heaving x 1 prior to arrival, nausea has now resolved.  He is not currently on any medications except for vitamins and he takes a baby aspirin which he took this morning.         The history is provided by the patient.       Home Medications Prior to Admission medications   Medication Sig Start Date End Date Taking? Authorizing Provider  acetaminophen (TYLENOL) 500 MG tablet Take 500 mg by mouth as needed.   Yes [provider]  ascorbic acid (VITAMIN C) 500 MG tablet Take 500 mg by mouth daily.   Yes [provider]  aspirin EC 81 MG tablet Take 1 tablet (81 mg total) by mouth daily with breakfast. 06/05/19  Yes Emokpae, Courage, MD  meclizine (ANTIVERT) 25 MG tablet Take 1 tablet (25 mg total) by mouth 3 (three) times daily as needed for dizziness. Patient taking differently: Take 25 mg by mouth as needed for dizziness. 08/05/19  Yes Bates, Crystal A, FNP  Multiple Vitamins-Minerals (CENTRUM SILVER ULTRA MENS) TABS Take 1 tablet by  mouth every morning.    Yes [provider]      Allergies    Patient has no known allergies.    Review of Systems   Review of Systems  Constitutional:  Positive for diaphoresis. Negative for fever.  HENT:  Negative for congestion and sore throat.   Eyes: Negative.   Respiratory:  Positive for cough and shortness of breath. Negative for chest tightness.   Cardiovascular:  Positive for chest pain. Negative for palpitations and leg swelling.  Gastrointestinal:  Positive for nausea. Negative for abdominal pain.  Genitourinary: Negative.   Musculoskeletal:  Negative for arthralgias, joint swelling and neck pain.  Skin: Negative.  Negative for rash and wound.  Neurological:  Negative for dizziness, weakness, light-headedness, numbness and headaches.  Psychiatric/Behavioral: Negative.      Physical Exam Updated Vital Signs BP 131/83   Pulse 80   Resp 20   Ht '6\' 5"'$  (1.956 m)   Wt 108.9 kg   SpO2 100%   BMI 28.46 kg/m  Physical Exam  ED Results / Procedures / Treatments   Labs (all labs ordered are listed, but only abnormal results are displayed) Labs Reviewed  BASIC METABOLIC PANEL - Abnormal; Notable for the following components:      Result Value   Sodium 133 (*)    Potassium 3.4 (*)    CO2 20 (*)  Glucose, Bld 107 (*)    All other components within normal limits  CBC  TROPONIN I (HIGH SENSITIVITY)  TROPONIN I (HIGH SENSITIVITY)    EKG EKG Interpretation  Date/Time:  Monday August 20 2022 13:51:37 EST Ventricular Rate:  82 PR Interval:  129 QRS Duration: 103 QT Interval:  382 QTC Calculation: 447 R Axis:   27 Text Interpretation: Sinus rhythm No significant change since last tracing Confirmed by Lajean Saver [id number]) on 08/20/2022 1:54:20 PM  Radiology DG Chest Port 1 View  Result Date: 08/20/2022 CLINICAL DATA:  Shortness of breath chest pain EXAM: PORTABLE CHEST 1 VIEW COMPARISON:  06/04/2019 FINDINGS: The heart size and mediastinal contours are  within normal limits. Both lungs are clear. The visualized skeletal structures are unremarkable. IMPRESSION: No active disease. Electronically Signed   By: Jerilynn Mages.  Shick M.D.   On: 08/20/2022 14:28    Procedures Procedures    Medications Ordered in ED Medications  nitroGLYCERIN (NITROSTAT) SL tablet 0.4 mg (0.4 mg Sublingual Given 08/20/22 1424)  ondansetron (ZOFRAN) injection 4 mg (4 mg Intravenous Given 08/20/22 1425)    ED Course/ Medical Decision Making/ A&P             HEART Score: 3                Medical Decision Making Pt with symptoms per HPI since yesterday.  Differential diagnoses including non-STEMI, pneumonia, unstable angina, arrhythmia, PE.  He has had an essentially normal workup today including a negative delta troponin, normal EKG, normal chest x-ray.  His vital signs have been stable here, no tachypnea, tachycardia or hypoxia, essentially helping to minimize likelihood this could be PE.  No risk factors for this condition.  He was ambulated in the department before discharge home and was symptom-free, no hypoxia, no shortness of breath or chest pain.  He is given referral to cardiology for outpatient consideration of stress testing.  He is agreeable to this, states he has had this in the past which have been negative.  Amount and/or Complexity of Data Reviewed Labs: ordered.    Details: Delta troponins are negative, B met and CBC are reassuring. Radiology: ordered.    Details: Chest x-ray is negative for acute findings.  No pneumonia, Discussion of management or test interpretation with external provider(s): Vent rate 82, sinus rhythm.  Risk Decision regarding hospitalization.           Final Clinical Impression(s) / ED Diagnoses Final diagnoses:  Nonspecific chest pain    Rx / DC Orders ED Discharge Orders          Ordered    Ambulatory referral to Cardiology       Comments: If you have not heard from the Cardiology office within the next 72 hours please  call [phone].   08/20/22 Minto, Jamyia Fortune, PA-C 08/20/22 1754    Evalee Jefferson, PA-C 08/20/22 1755    Lajean Saver, MD 08/23/22 [phone]

## 2022-08-20 NOTE — ED Triage Notes (Signed)
Pt came in by recems from urgent care for chest pain; pt states the chest pain started yesterday while sitting down; pt states the pain is intermittent and he has had some nausea  Pt took an aspirin at home and ems administered '243mg'$  asa and one nitro  Pt denies any pain at this time

## 2022-08-20 NOTE — ED Notes (Signed)
Patient is being discharged from the Urgent Care and sent to the Emergency Department via EMS . Per provider Maricela Bo., patient is in need of higher level of care due to chest pain, SOB. Patient is aware and verbalizes understanding of plan of care.  Vitals:   08/20/22 1319  BP: (!) 162/76  Pulse: 80  Resp: 20  Temp: 98 F (36.7 C)  SpO2: 98%

## 2022-08-20 NOTE — ED Notes (Signed)
Pt ambulated around nurse's station with no new complaints. Pt denied shortness of breath while walking. SpO2 remained at 99% on room air the entire time.

## 2022-08-23 ENCOUNTER — Telehealth: Payer: Self-pay | Admitting: *Deleted

## 2022-08-23 NOTE — Transitions of Care (Post Inpatient/ED Visit) (Signed)
   08/23/2022  Name: Charles Campbell MRN: [id number] DOB: May 08, 1961  Today's TOC FU Call Status: Today's TOC FU Call Status:: Unsuccessul Call (1st Attempt) Unsuccessful Call (1st Attempt) Date: 08/23/22  Attempted to reach the patient regarding the most recent Inpatient/ED visit. EMMI Flag for "no follow-up scheduled." [facility] in [place] has reached out and left a message for patient to return their call to schedule.   Follow Up Plan: Additional outreach attempts will be made to reach the patient to complete the Transitions of Care (Post Inpatient/ED visit) call.   Chong Sicilian, BSN, RN-BC RN Care Coordinator [place] Direct Dial: [phone] Main #: [phone]

## 2022-08-28 ENCOUNTER — Telehealth: Payer: Self-pay | Admitting: *Deleted

## 2022-08-28 NOTE — Transitions of Care (Post Inpatient/ED Visit) (Signed)
   08/28/2022  Name: Charles Campbell MRN: [id number] DOB: 02-Jan-1961  Today's TOC FU Call Status: Today's TOC FU Call Status:: Unsuccessful Call (2nd Attempt) Unsuccessful Call (2nd Attempt) Date: 08/28/22  Attempted to reach the patient regarding the most recent Inpatient/ED visit.  Follow Up Plan: Additional outreach attempts will be made to reach the patient to complete the Transitions of Care (Post Inpatient/ED visit) call.   Patient needs to call [facility] in [place] [phone] to schedule a follow-up appointment. Referral in place and they have attempted to reach him unsuccessfully as well.   Chong Sicilian, BSN, RN-BC RN Care Coordinator [place] Direct Dial: [phone] Main #: [phone]

## 2022-09-04 ENCOUNTER — Telehealth: Payer: Self-pay

## 2022-09-04 ENCOUNTER — Ambulatory Visit (INDEPENDENT_AMBULATORY_CARE_PROVIDER_SITE_OTHER): Payer: Medicaid Other | Admitting: Family Medicine

## 2022-09-04 ENCOUNTER — Encounter: Payer: Self-pay | Admitting: Family Medicine

## 2022-09-04 VITALS — BP 120/78 | HR 74 | Temp 98.1°F | Ht 77.0 in | Wt 242.0 lb

## 2022-09-04 DIAGNOSIS — Z23 Encounter for immunization: Secondary | ICD-10-CM | POA: Diagnosis not present

## 2022-09-04 DIAGNOSIS — Z0000 Encounter for general adult medical examination without abnormal findings: Secondary | ICD-10-CM

## 2022-09-04 DIAGNOSIS — E663 Overweight: Secondary | ICD-10-CM | POA: Diagnosis not present

## 2022-09-04 DIAGNOSIS — Z0001 Encounter for general adult medical examination with abnormal findings: Secondary | ICD-10-CM | POA: Diagnosis not present

## 2022-09-04 DIAGNOSIS — Z125 Encounter for screening for malignant neoplasm of prostate: Secondary | ICD-10-CM

## 2022-09-04 DIAGNOSIS — B370 Candidal stomatitis: Secondary | ICD-10-CM | POA: Insufficient documentation

## 2022-09-04 DIAGNOSIS — E785 Hyperlipidemia, unspecified: Secondary | ICD-10-CM | POA: Diagnosis not present

## 2022-09-04 DIAGNOSIS — Z1211 Encounter for screening for malignant neoplasm of colon: Secondary | ICD-10-CM

## 2022-09-04 HISTORY — DX: Encounter for general adult medical examination without abnormal findings: Z00.00

## 2022-09-04 MED ORDER — NYSTATIN 100000 UNIT/ML MT SUSP: 5.0000 mL | Freq: Four times a day (QID) | OROMUCOSAL | 0 refills | Status: DC

## 2022-09-04 NOTE — Assessment & Plan Note (Addendum)
Today your medical history was reviewed and routine physical exam with labs was performed. Recommend 150 minutes of moderate intensity exercise weekly and consuming a well-balanced diet. Advised to stop smoking if a smoker, avoid smoking if a non-smoker, limit alcohol consumption to 1 drink per day for women and 2 drinks per day for men, and avoid illicit drug use. Vaccine maintenance discussed. Appropriate health maintenance items reviewed. Return to office in 1 year for annual physical exam.

## 2022-09-04 NOTE — Assessment & Plan Note (Signed)
Will check fasting labs in AM. Counseled on heart healthy diet and encouraged 150 minutes moderate intensity exercise weekly.

## 2022-09-04 NOTE — Transitions of Care (Post Inpatient/ED Visit) (Signed)
   09/04/2022  Name: Charles Campbell MRN: [id number] DOB: [birth year]  Today's TOC FU Call Status: Today's TOC FU Call Status:: Successful TOC FU Call Competed TOC FU Call Complete Date: 09/04/22  Transition Care Management Follow-up Telephone Call Date of Discharge: 08/21/22 Discharge [facility] Type of Discharge: Emergency Department Reason for ED Visit: Other: ("non specific chest pain) How have you been since you were released from the hospital?: Better Any questions or concerns?: No  Items Reviewed: Did you receive and understand the discharge instructions provided?: Yes Medications obtained and verified?: No (patient states he just returned home from seeing new PCP and meds reviewed during visit) Any new allergies since your discharge?: No Dietary orders reviewed?: Yes Type of Diet Ordered:: low salt/heart healthy Do you have support at home?: Yes People in Home: child(ren), adult, alone Name of Support/Comfort Primary Source: [place] and Equipment/Supplies: [place] Ordered?: NA Any new equipment or medical supplies ordered?: NA  Functional Questionnaire: Do you need assistance with bathing/showering or dressing?: No Do you need assistance with meal preparation?: No Do you need assistance with eating?: No Do you have difficulty maintaining continence: No Do you need assistance with getting out of bed/getting out of a chair/moving?: No Do you have difficulty managing or taking your medications?: No  Follow up appointments reviewed: PCP Follow-up appointment confirmed?: Yes Date of PCP follow-up appointment?: 09/04/22 Follow-up Provider: [facility] Follow-up appointment confirmed?: No Reason Specialist Follow-Up Not Confirmed: Patient has Specialist Provider Number and will Call for Appointment (patient instructed to follow up with referral to cardiology office-call to make an appt) Do you need  transportation to your follow-up appointment?: No Do you understand care options if your condition(s) worsen?: Yes-patient verbalized understanding  SDOH Interventions Today    Flowsheet Row Most Recent Value  SDOH Interventions   Food Insecurity Interventions Intervention Not Indicated  Transportation Interventions Intervention Not Indicated      TOC Interventions Today    Flowsheet Row Most Recent Value  TOC Interventions   TOC Interventions Discussed/Reviewed TOC Interventions Discussed      Interventions Today    Flowsheet Row Most Recent Value  General Interventions   General Interventions Discussed/Reviewed General Interventions Discussed, Doctor Visits  Doctor Visits Discussed/Reviewed Doctor Visits Discussed, PCP, Specialist  PCP/Specialist Visits Compliance with follow-up visit  Nutrition Interventions   Nutrition Discussed/Reviewed Nutrition Discussed, Decreasing salt, Adding fruits and vegetables  Pharmacy Interventions   Pharmacy Dicussed/Reviewed Pharmacy Topics Discussed, Medications and their functions       Hetty Blend [facility] Care Management Care Management Community Coordinator Direct Phone: [phone] Toll Free: [phone] Fax: [phone]

## 2022-09-04 NOTE — Assessment & Plan Note (Signed)
White patch on posterior tongue, can be scraped off with tongue depressor, consistent with thrush. Will treat with nystatin 500,000 units 4 times daily for 7 days.

## 2022-09-04 NOTE — Patient Instructions (Signed)
It was great to meet you today and I'm excited to have you join the [facility] practice. I hope you had a positive experience today! If you feel so inclined, please feel free to recommend our practice to friends and family. Xavien Dauphinais, FNP-C  

## 2022-09-04 NOTE — Addendum Note (Signed)
Addended by: Colman Cater on: 09/04/2022 03:50 PM   Modules accepted: Orders

## 2022-09-04 NOTE — Progress Notes (Signed)
New Patient Office Visit  Subjective    Patient ID: Charles Campbell, male    DOB: 1961/06/12  Age: 62 y.o. MRN: [id number]  CC:  Chief Complaint  Patient presents with   Establish Care    HPI Charles Campbell presents to establish care. Oriented to practice routines and expectations. PMH includes HLD, a-fib. Concerns include 1 month of white coating to his tongue. Was seen in ED earlier this month for chest pain and had an extensive workup, he suspects it was due to a viral illness, he reports mucopurulent cough with rhinorrhea. No acute chest pain, fever, SOB, or wheeze.  Prostate CA: PSA ordered Colon CA: cologuard ordered Vaccines: shingles today Tobacco never    Outpatient Encounter Medications as of 09/04/2022  Medication Sig   acetaminophen (TYLENOL) 500 MG tablet Take 500 mg by mouth as needed.   ascorbic acid (VITAMIN C) 500 MG tablet Take 500 mg by mouth daily.   aspirin EC 81 MG tablet Take 1 tablet (81 mg total) by mouth daily with breakfast.   Multiple Vitamins-Minerals (CENTRUM SILVER ULTRA MENS) TABS Take 1 tablet by mouth every morning.    nystatin (MYCOSTATIN) 100000 UNIT/ML suspension Take 5 mLs (500,000 Units total) by mouth 4 (four) times daily.   meclizine (ANTIVERT) 25 MG tablet Take 1 tablet (25 mg total) by mouth 3 (three) times daily as needed for dizziness. (Patient not taking: Reported on 09/04/2022)   No facility-administered encounter medications on file as of 09/04/2022.    Past Medical History:  Diagnosis Date   A-fib ([place]) 05/2019   Arthritis    Wrist   H/O hiatal hernia    watches diet   Hyperlipidemia    Physical exam, annual 09/04/2022    Past Surgical History:  Procedure Laterality Date   COLONOSCOPY  2009   FINGER SURGERY     Left index- due to injury   MULTIPLE EXTRACTIONS WITH ALVEOLOPLASTY  12/24/2011   Procedure: MULTIPLE EXTRACION WITH ALVEOLOPLASTY;  Surgeon: Gae Bon, DDS;  Location: [facility];  Service: Oral Surgery;   Laterality: Bilateral;    Family History  Problem Relation Age of Onset   Cancer Mother        lung   Asthma Mother    Heart disease Father 34       heart attack   Cancer Brother        brain   Cancer Brother     Social History   Socioeconomic History   Marital status: Divorced    Spouse name: Not on file   Number of children: 1   Years of education: 12   Highest education level: Not on file  Occupational History   Occupation: Associate Professor  Tobacco Use   Smoking status: Never   Smokeless tobacco: Never  Vaping Use   Vaping Use: Not on file  Substance and Sexual Activity   Alcohol use: No   Drug use: No   Sexual activity: Not Currently  Other Topics Concern   Not on file  Social History Narrative   Lives alone   One dog, lab   Social Determinants of Health   Financial Resource Strain: Not on file  Food Insecurity: Not on file  Transportation Needs: Not on file  Physical Activity: Not on file  Stress: Not on file  Social Connections: Not on file  Intimate Partner Violence: Not on file    Review of Systems  Constitutional: Negative.   HENT:  Positive for  tinnitus.   Eyes: Negative.   Respiratory:  Positive for cough and sputum production.   Gastrointestinal: Negative.   Genitourinary: Negative.   Musculoskeletal: Negative.   Skin: Negative.   Neurological: Negative.   Endo/Heme/Allergies: Negative.   Psychiatric/Behavioral: Negative.    All other systems reviewed and are negative.       Objective    BP 120/78   Pulse 74   Temp 98.1 F (36.7 C) (Oral)   Ht 6\' 5"  (1.956 m)   Wt 242 lb (109.8 kg)   SpO2 97%   BMI 28.70 kg/m   Physical Exam Vitals and nursing note reviewed.  Constitutional:      Appearance: Normal appearance. He is normal weight.  HENT:     Head: Normocephalic and atraumatic.     Right Ear: Tympanic membrane, ear canal and external ear normal. There is impacted cerumen.     Left Ear: Tympanic membrane, ear canal and  external ear normal.     Nose: Nose normal.     Mouth/Throat:     Mouth: Mucous membranes are moist.     Pharynx: Oropharynx is clear.  Eyes:     Extraocular Movements: Extraocular movements intact.     Right eye: Normal extraocular motion and no nystagmus.     Left eye: Normal extraocular motion and no nystagmus.     Conjunctiva/sclera: Conjunctivae normal.     Pupils: Pupils are equal, round, and reactive to light.  Cardiovascular:     Rate and Rhythm: Normal rate and regular rhythm.     Pulses: Normal pulses.     Heart sounds: Murmur heard.  Pulmonary:     Effort: Pulmonary effort is normal.     Breath sounds: Normal breath sounds.  Abdominal:     General: Bowel sounds are normal.     Palpations: Abdomen is soft.  Genitourinary:    Comments: Deferred using shared decision making Musculoskeletal:        General: Normal range of motion.     Cervical back: Normal range of motion and neck supple.  Skin:    General: Skin is warm and dry.     Capillary Refill: Capillary refill takes less than 2 seconds.  Neurological:     General: No focal deficit present.     Mental Status: He is alert. Mental status is at baseline.  Psychiatric:        Mood and Affect: Mood normal.        Speech: Speech normal.        Behavior: Behavior normal.        Thought Content: Thought content normal.        Cognition and Memory: Cognition and memory normal.        Judgment: Judgment normal.         Assessment & Plan:   Problem List Items Addressed This Visit       Digestive   Oral candidiasis    White patch on posterior tongue, can be scraped off with tongue depressor, consistent with thrush. Will treat with nystatin 500,000 units 4 times daily for 7 days.      Relevant Medications   nystatin (MYCOSTATIN) 100000 UNIT/ML suspension     Other   HLD (hyperlipidemia)    Will check fasting labs in AM. Counseled on heart healthy diet and encouraged 150 minutes moderate intensity exercise  weekly.      Relevant Orders   CBC with Differential/Platelet   COMPLETE METABOLIC PANEL WITH GFR  Lipid panel   Overweight (BMI 25.0-29.9)   Relevant Orders   CBC with Differential/Platelet   COMPLETE METABOLIC PANEL WITH GFR   Physical exam, annual - Primary    Today your medical history was reviewed and routine physical exam with labs was performed. Recommend 150 minutes of moderate intensity exercise weekly and consuming a well-balanced diet. Advised to stop smoking if a smoker, avoid smoking if a non-smoker, limit alcohol consumption to 1 drink per day for women and 2 drinks per day for men, and avoid illicit drug use. Vaccine maintenance discussed. Appropriate health maintenance items reviewed. Return to office in 1 year for annual physical exam.      Relevant Orders   CBC with Differential/Platelet   COMPLETE METABOLIC PANEL WITH GFR   PSA   Other Visit Diagnoses     Prostate cancer screening       Relevant Orders   PSA   Colon cancer screening       Relevant Orders   Cologuard       Return in about 6 months (around 03/07/2023) for hyperlipidemia and fasting labs in AM.   Rubie Maid, [place]

## 2022-09-05 ENCOUNTER — Other Ambulatory Visit: Payer: Medicaid Other

## 2022-09-05 DIAGNOSIS — R739 Hyperglycemia, unspecified: Secondary | ICD-10-CM

## 2022-09-05 DIAGNOSIS — E663 Overweight: Secondary | ICD-10-CM

## 2022-09-05 DIAGNOSIS — Z0000 Encounter for general adult medical examination without abnormal findings: Secondary | ICD-10-CM

## 2022-09-05 DIAGNOSIS — Z125 Encounter for screening for malignant neoplasm of prostate: Secondary | ICD-10-CM

## 2022-09-05 DIAGNOSIS — E785 Hyperlipidemia, unspecified: Secondary | ICD-10-CM

## 2022-09-08 LAB — TEST AUTHORIZATION 2

## 2022-09-08 LAB — HEMOGLOBIN A1C
Hgb A1c MFr Bld: 5.5 % of total Hgb (ref <5.7)
Mean Plasma Glucose: 111 mg/dL
eAG (mmol/L): 6.2 mmol/L

## 2022-09-08 LAB — COMPLETE METABOLIC PANEL WITH GFR
AG Ratio: 1.6 (calc) (ref 1.0–2.5)
ALT: 21 U/L (ref 9–46)
AST: 16 U/L (ref 10–35)
Albumin: 4.4 g/dL (ref 3.6–5.1)
Alkaline phosphatase (APISO): 62 U/L (ref 35–144)
BUN: 15 mg/dL (ref 7–25)
CO2: 24 mmol/L (ref 20–32)
Calcium: 9.4 mg/dL (ref 8.6–10.3)
Chloride: 103 mmol/L (ref 98–110)
Creat: 1.12 mg/dL (ref 0.70–1.35)
Globulin: 2.7 g/dL (calc) (ref 1.9–3.7)
Glucose, Bld: 122 mg/dL — ABNORMAL HIGH (ref 65–99)
Potassium: 4.2 mmol/L (ref 3.5–5.3)
Sodium: 142 mmol/L (ref 135–146)
Total Bilirubin: 0.5 mg/dL (ref 0.2–1.2)
Total Protein: 7.1 g/dL (ref 6.1–8.1)
eGFR: 74 mL/min/{1.73_m2} (ref >=60)

## 2022-09-08 LAB — LIPID PANEL
Cholesterol: 219 mg/dL — ABNORMAL HIGH (ref <200)
HDL: 44 mg/dL (ref >=40)
LDL Cholesterol (Calc): 141 mg/dL (calc) — ABNORMAL HIGH
Non-HDL Cholesterol (Calc): 175 mg/dL (calc) — ABNORMAL HIGH (ref <130)
Total CHOL/HDL Ratio: 5 (calc) — ABNORMAL HIGH (ref <5.0)
Triglycerides: 202 mg/dL — ABNORMAL HIGH (ref <150)

## 2022-09-08 LAB — CBC WITH DIFFERENTIAL/PLATELET
Absolute Monocytes: 404 cells/uL (ref 200–950)
Basophils Absolute: 52 cells/uL (ref 0–200)
Basophils Relative: 0.6 %
Eosinophils Absolute: 138 cells/uL (ref 15–500)
Eosinophils Relative: 1.6 %
HCT: 47.5 % (ref 38.5–50.0)
Hemoglobin: 16.4 g/dL (ref 13.2–17.1)
Lymphs Abs: 1359 cells/uL (ref 850–3900)
MCH: 30.4 pg (ref 27.0–33.0)
MCHC: 34.5 g/dL (ref 32.0–36.0)
MCV: 88.1 fL (ref 80.0–100.0)
MPV: 10.7 fL (ref 7.5–12.5)
Monocytes Relative: 4.7 %
Neutro Abs: 6648 cells/uL (ref 1500–7800)
Neutrophils Relative %: 77.3 %
Platelets: 226 10*3/uL (ref 140–400)
RBC: 5.39 10*6/uL (ref 4.20–5.80)
RDW: 13.2 % (ref 11.0–15.0)
Total Lymphocyte: 15.8 %
WBC: 8.6 10*3/uL (ref 3.8–10.8)

## 2022-09-08 LAB — PSA: PSA: 1.4 ng/mL (ref <=4.00)

## 2022-09-10 MED ORDER — ATORVASTATIN CALCIUM 10 MG PO TABS: 10.0000 mg | ORAL_TABLET | Freq: Every day | ORAL | 3 refills | Status: DC

## 2022-09-10 NOTE — Addendum Note (Signed)
Addended by: Rubie Maid on: 09/10/2022 08:09 AM   Modules accepted: Orders

## 2022-09-13 LAB — COLOGUARD

## 2022-09-17 DIAGNOSIS — Z419 Encounter for procedure for purposes other than remedying health state, unspecified: Secondary | ICD-10-CM | POA: Diagnosis not present

## 2022-09-20 ENCOUNTER — Other Ambulatory Visit: Payer: Self-pay | Admitting: Family Medicine

## 2022-09-20 DIAGNOSIS — Z1211 Encounter for screening for malignant neoplasm of colon: Secondary | ICD-10-CM | POA: Diagnosis not present

## 2022-09-27 LAB — COLOGUARD: COLOGUARD: NEGATIVE

## 2022-10-17 DIAGNOSIS — Z419 Encounter for procedure for purposes other than remedying health state, unspecified: Secondary | ICD-10-CM | POA: Diagnosis not present

## 2022-11-17 DIAGNOSIS — Z419 Encounter for procedure for purposes other than remedying health state, unspecified: Secondary | ICD-10-CM | POA: Diagnosis not present

## 2022-12-17 DIAGNOSIS — Z419 Encounter for procedure for purposes other than remedying health state, unspecified: Secondary | ICD-10-CM | POA: Diagnosis not present

## 2022-12-28 ENCOUNTER — Telehealth: Payer: Self-pay | Admitting: Family Medicine

## 2022-12-28 ENCOUNTER — Other Ambulatory Visit: Payer: Self-pay | Admitting: Family Medicine

## 2022-12-28 MED ORDER — NIRMATRELVIR/RITONAVIR (PAXLOVID)TABLET: 3.0000 | ORAL_TABLET | Freq: Two times a day (BID) | ORAL | 0 refills | Status: AC

## 2022-12-28 NOTE — Telephone Encounter (Signed)
Patient called back; result is positive. Stated his entire family has COVID.  Please see previous message in thread for complete message.

## 2022-12-28 NOTE — Telephone Encounter (Signed)
Pt called back and he did test + for Covid. Pt asks if rx can be sent in for him? Thank you.

## 2022-12-28 NOTE — Telephone Encounter (Signed)
Patient called to notify  provider he believes he has COVID; SA:YTKZ throat, gas, head cold, hoarse, sore in chest, coughing up phlegm (white) - hurts to cough, chills.  Requesting for provider to call in medication to pharmacy to manage symptoms which began Tuesday but worsened today.   Patient taking a COVID test now and will call back with results.  Pharmacy confirmed as:   [facility] [place], [place] - [address], [place] [address] Phone: [phone]  Fax: [phone]   Please advise patient at [phone].

## 2023-01-17 DIAGNOSIS — Z419 Encounter for procedure for purposes other than remedying health state, unspecified: Secondary | ICD-10-CM | POA: Diagnosis not present

## 2023-02-17 DIAGNOSIS — Z419 Encounter for procedure for purposes other than remedying health state, unspecified: Secondary | ICD-10-CM | POA: Diagnosis not present

## 2023-03-07 ENCOUNTER — Encounter: Payer: Self-pay | Admitting: Family Medicine

## 2023-03-07 ENCOUNTER — Ambulatory Visit (INDEPENDENT_AMBULATORY_CARE_PROVIDER_SITE_OTHER): Payer: Medicaid Other | Admitting: Family Medicine

## 2023-03-07 VITALS — BP 138/80 | HR 88 | Temp 98.3°F | Ht 77.0 in | Wt 254.0 lb

## 2023-03-07 DIAGNOSIS — E7800 Pure hypercholesterolemia, unspecified: Secondary | ICD-10-CM

## 2023-03-07 DIAGNOSIS — Z23 Encounter for immunization: Secondary | ICD-10-CM

## 2023-03-07 MED ORDER — NYSTATIN 100000 UNIT/ML MT SUSP: 5.0000 mL | Freq: Four times a day (QID) | OROMUCOSAL | 0 refills | Status: DC

## 2023-03-07 NOTE — Progress Notes (Signed)
Subjective:  HPI: Charles Campbell is a 62 y.o. male presenting on 03/07/2023 for Follow-up (6 month f/u and getting 2nd vaccine)   HPI Patient is in today for chronic condition follow up for hyperlipidemia. Unfortunately he did not start the prescribed Atorvastatin and did not have fasting labs drawn prior to today. He did report eating a healthier diet including cheerios in the morning and using an air fryer.  HYPERLIPIDEMIA Hyperlipidemia status:  heart healthy diet, not taking atorvastatin Satisfied with current treatment?   Not taking medications Side effects:   not taking Medication compliance:  not making Past cholesterol meds:  Supplements: none Aspirin:  yes The 10-year ASCVD risk score (Arnett DK, et al., 2019) is: 15.4%   Values used to calculate the score:     Age: 71 years     Sex: Male     Is Non-Hispanic African American: No     Diabetic: No     Tobacco smoker: No     Systolic Blood Pressure: 138 mmHg     Is BP treated: No     HDL Cholesterol: 44 mg/dL     Total Cholesterol: 219 mg/dL Chest pain:  no Coronary artery disease:  no Family history CAD:  no Family history early CAD:  no   Review of Systems  All other systems reviewed and are negative.   Relevant past medical history reviewed and updated as indicated.   Past Medical History:  Diagnosis Date   A-fib (HCC) 05/2019   Arthritis    Wrist   H/O hiatal hernia    watches diet   Hyperlipidemia    Physical exam, annual 09/04/2022     Past Surgical History:  Procedure Laterality Date   COLONOSCOPY  2009   FINGER SURGERY     Left index- due to injury   MULTIPLE EXTRACTIONS WITH ALVEOLOPLASTY  12/24/2011   Procedure: MULTIPLE EXTRACION WITH ALVEOLOPLASTY;  Surgeon: [name], DDS;  Location: [facility] OR;  Service: Oral Surgery;  Laterality: Bilateral;    Allergies and medications reviewed and updated.   Current Outpatient Medications:    ascorbic acid (VITAMIN C) 500 MG tablet, Take 500 mg by  mouth daily., Disp: , Rfl:    aspirin EC 81 MG tablet, Take 1 tablet (81 mg total) by mouth daily with breakfast., Disp: 30 tablet, Rfl: 5   atorvastatin (LIPITOR) 10 MG tablet, Take 1 tablet (10 mg total) by mouth daily., Disp: 90 tablet, Rfl: 3   Multiple Vitamins-Minerals (CENTRUM SILVER ULTRA MENS) TABS, Take 1 tablet by mouth every morning. , Disp: , Rfl:    nystatin (MYCOSTATIN) 100000 UNIT/ML suspension, Take 5 mLs (500,000 Units total) by mouth 4 (four) times daily., Disp: 60 mL, Rfl: 0  No Known Allergies  Objective:   BP 138/80   Pulse 88   Temp 98.3 F (36.8 C) (Oral)   Ht 6\' 5"  (1.956 m)   Wt 254 lb (115.2 kg)   SpO2 95%   BMI 30.12 kg/m      03/07/2023   10:09 AM 03/07/2023   10:01 AM 09/04/2022    2:07 PM  Vitals with BMI  Height  6\' 5"  6\' 5"   Weight  254 lbs 242 lbs  BMI  30.11 28.69  Systolic 138 142 161  Diastolic 80 80 78  Pulse  88 74     Physical Exam Vitals and nursing note reviewed.  Constitutional:      Appearance: Normal appearance. He is normal weight.  HENT:     Head: Normocephalic and atraumatic.  Cardiovascular:     Rate and Rhythm: Normal rate and regular rhythm.     Pulses: Normal pulses.     Heart sounds: Normal heart sounds.  Pulmonary:     Effort: Pulmonary effort is normal.     Breath sounds: Normal breath sounds.  Skin:    General: Skin is warm and dry.     Capillary Refill: Capillary refill takes less than 2 seconds.  Neurological:     General: No focal deficit present.     Mental Status: He is alert and oriented to person, place, and time. Mental status is at baseline.  Psychiatric:        Mood and Affect: Mood normal.        Behavior: Behavior normal.        Thought Content: Thought content normal.        Judgment: Judgment normal.     Assessment & Plan:  Pure hypercholesterolemia Assessment & Plan: Will obtain fasting labs. Discussed with him that he will likely need to be started on a medication to help lower his  cholesterol due to his ASCVD risk. I recommend consuming a heart healthy diet such as Mediterranean diet or DASH diet with whole grains, fruits, vegetable, fish, lean meats, nuts, and olive oil. Limit sweets and processed foods. I also encourage moderate intensity exercise 150 minutes weekly. This is 3-5 times weekly for 30-50 minutes each session. Goal should be pace of 3 miles/hours, or walking 1.5 miles in 30 minutes. The 10-year ASCVD risk score (Arnett DK, et al., 2019) is: 15.4%   Orders: -     CBC with Differential/Platelet -     COMPLETE METABOLIC PANEL WITH GFR -     Lipid panel  Other orders -     Nystatin; Take 5 mLs (500,000 Units total) by mouth 4 (four) times daily.  Dispense: 60 mL; Refill: 0     Follow up plan: Return in about 6 months (around 09/04/2023) for ASAP fasting labs and 6 mo chronic follow-up with labs 1 week prior.  Park Meo, FNP

## 2023-03-07 NOTE — Assessment & Plan Note (Signed)
Will obtain fasting labs. Discussed with him that he will likely need to be started on a medication to help lower his cholesterol due to his ASCVD risk. I recommend consuming a heart healthy diet such as Mediterranean diet or DASH diet with whole grains, fruits, vegetable, fish, lean meats, nuts, and olive oil. Limit sweets and processed foods. I also encourage moderate intensity exercise 150 minutes weekly. This is 3-5 times weekly for 30-50 minutes each session. Goal should be pace of 3 miles/hours, or walking 1.5 miles in 30 minutes. The 10-year ASCVD risk score (Arnett DK, et al., 2019) is: 15.4%

## 2023-03-07 NOTE — Addendum Note (Signed)
Addended by: Arta Silence on: 03/07/2023 04:05 PM   Modules accepted: Orders

## 2023-03-08 ENCOUNTER — Other Ambulatory Visit: Payer: Medicaid Other

## 2023-03-08 DIAGNOSIS — E7800 Pure hypercholesterolemia, unspecified: Secondary | ICD-10-CM | POA: Diagnosis not present

## 2023-03-09 LAB — COMPLETE METABOLIC PANEL WITH GFR
AG Ratio: 1.7 (calc) (ref 1.0–2.5)
ALT: 22 U/L (ref 9–46)
AST: 17 U/L (ref 10–35)
Albumin: 4.3 g/dL (ref 3.6–5.1)
Alkaline phosphatase (APISO): 57 U/L (ref 35–144)
BUN: 12 mg/dL (ref 7–25)
CO2: 26 mmol/L (ref 20–32)
Calcium: 9.1 mg/dL (ref 8.6–10.3)
Chloride: 103 mmol/L (ref 98–110)
Creat: 1.12 mg/dL (ref 0.70–1.35)
Globulin: 2.6 g/dL (calc) (ref 1.9–3.7)
Glucose, Bld: 91 mg/dL (ref 65–99)
Potassium: 4.2 mmol/L (ref 3.5–5.3)
Sodium: 138 mmol/L (ref 135–146)
Total Bilirubin: 0.8 mg/dL (ref 0.2–1.2)
Total Protein: 6.9 g/dL (ref 6.1–8.1)
eGFR: 73 mL/min/{1.73_m2} (ref >=60)

## 2023-03-09 LAB — CBC WITH DIFFERENTIAL/PLATELET
Absolute Monocytes: 481 cells/uL (ref 200–950)
Basophils Absolute: 52 cells/uL (ref 0–200)
Basophils Relative: 0.7 %
Eosinophils Absolute: 222 cells/uL (ref 15–500)
Eosinophils Relative: 3 %
HCT: 47.2 % (ref 38.5–50.0)
Hemoglobin: 15.5 g/dL (ref 13.2–17.1)
Lymphs Abs: 1221 cells/uL (ref 850–3900)
MCH: 30 pg (ref 27.0–33.0)
MCHC: 32.8 g/dL (ref 32.0–36.0)
MCV: 91.3 fL (ref 80.0–100.0)
MPV: 10.9 fL (ref 7.5–12.5)
Monocytes Relative: 6.5 %
Neutro Abs: 5424 cells/uL (ref 1500–7800)
Neutrophils Relative %: 73.3 %
Platelets: 215 10*3/uL (ref 140–400)
RBC: 5.17 10*6/uL (ref 4.20–5.80)
RDW: 13.2 % (ref 11.0–15.0)
Total Lymphocyte: 16.5 %
WBC: 7.4 10*3/uL (ref 3.8–10.8)

## 2023-03-09 LAB — LIPID PANEL
Cholesterol: 216 mg/dL — ABNORMAL HIGH (ref <200)
HDL: 41 mg/dL (ref >=40)
LDL Cholesterol (Calc): 142 mg/dL (calc) — ABNORMAL HIGH
Non-HDL Cholesterol (Calc): 175 mg/dL (calc) — ABNORMAL HIGH (ref <130)
Total CHOL/HDL Ratio: 5.3 (calc) — ABNORMAL HIGH (ref <5.0)
Triglycerides: 190 mg/dL — ABNORMAL HIGH (ref <150)

## 2023-03-11 ENCOUNTER — Other Ambulatory Visit: Payer: Self-pay | Admitting: Family Medicine

## 2023-03-11 MED ORDER — ATORVASTATIN CALCIUM 10 MG PO TABS: 10.0000 mg | ORAL_TABLET | Freq: Every day | ORAL | 3 refills | Status: DC

## 2023-04-19 DIAGNOSIS — Z419 Encounter for procedure for purposes other than remedying health state, unspecified: Secondary | ICD-10-CM | POA: Diagnosis not present

## 2023-05-19 DIAGNOSIS — Z419 Encounter for procedure for purposes other than remedying health state, unspecified: Secondary | ICD-10-CM | POA: Diagnosis not present

## 2023-06-19 DIAGNOSIS — Z419 Encounter for procedure for purposes other than remedying health state, unspecified: Secondary | ICD-10-CM | POA: Diagnosis not present

## 2023-07-20 DIAGNOSIS — Z419 Encounter for procedure for purposes other than remedying health state, unspecified: Secondary | ICD-10-CM | POA: Diagnosis not present

## 2023-08-17 DIAGNOSIS — Z419 Encounter for procedure for purposes other than remedying health state, unspecified: Secondary | ICD-10-CM | POA: Diagnosis not present

## 2023-08-30 ENCOUNTER — Other Ambulatory Visit: Payer: Medicaid Other

## 2023-08-30 DIAGNOSIS — Z125 Encounter for screening for malignant neoplasm of prostate: Secondary | ICD-10-CM | POA: Diagnosis not present

## 2023-08-30 DIAGNOSIS — R55 Syncope and collapse: Secondary | ICD-10-CM

## 2023-08-30 DIAGNOSIS — E663 Overweight: Secondary | ICD-10-CM

## 2023-08-30 DIAGNOSIS — R739 Hyperglycemia, unspecified: Secondary | ICD-10-CM | POA: Diagnosis not present

## 2023-08-30 DIAGNOSIS — E7800 Pure hypercholesterolemia, unspecified: Secondary | ICD-10-CM

## 2023-08-30 DIAGNOSIS — I4891 Unspecified atrial fibrillation: Secondary | ICD-10-CM

## 2023-08-31 LAB — COMPLETE METABOLIC PANEL WITH GFR
AG Ratio: 1.8 (calc) (ref 1.0–2.5)
ALT: 21 U/L (ref 9–46)
AST: 18 U/L (ref 10–35)
Albumin: 4.2 g/dL (ref 3.6–5.1)
Alkaline phosphatase (APISO): 53 U/L (ref 35–144)
BUN: 17 mg/dL (ref 7–25)
CO2: 28 mmol/L (ref 20–32)
Calcium: 9 mg/dL (ref 8.6–10.3)
Chloride: 103 mmol/L (ref 98–110)
Creat: 1.17 mg/dL (ref 0.70–1.35)
Globulin: 2.4 g/dL (ref 1.9–3.7)
Glucose, Bld: 118 mg/dL — ABNORMAL HIGH (ref 65–99)
Potassium: 4.3 mmol/L (ref 3.5–5.3)
Sodium: 139 mmol/L (ref 135–146)
Total Bilirubin: 0.5 mg/dL (ref 0.2–1.2)
Total Protein: 6.6 g/dL (ref 6.1–8.1)
eGFR: 70 mL/min/{1.73_m2} (ref >=60)

## 2023-09-04 ENCOUNTER — Ambulatory Visit: Payer: Medicaid Other | Admitting: Family Medicine

## 2023-09-04 ENCOUNTER — Encounter: Payer: Self-pay | Admitting: Family Medicine

## 2023-09-04 VITALS — BP 138/82 | HR 92 | Ht 77.0 in | Wt 250.6 lb

## 2023-09-04 DIAGNOSIS — E785 Hyperlipidemia, unspecified: Secondary | ICD-10-CM | POA: Diagnosis not present

## 2023-09-04 DIAGNOSIS — Z0000 Encounter for general adult medical examination without abnormal findings: Secondary | ICD-10-CM

## 2023-09-04 DIAGNOSIS — Z0001 Encounter for general adult medical examination with abnormal findings: Secondary | ICD-10-CM | POA: Diagnosis not present

## 2023-09-04 DIAGNOSIS — H6121 Impacted cerumen, right ear: Secondary | ICD-10-CM

## 2023-09-04 MED ORDER — NYSTATIN 100000 UNIT/ML MT SUSP: 5.0000 mL | Freq: Four times a day (QID) | OROMUCOSAL | 0 refills | Status: AC

## 2023-09-04 MED ORDER — ATORVASTATIN CALCIUM 10 MG PO TABS: 10.0000 mg | ORAL_TABLET | Freq: Every day | ORAL | 3 refills | Status: DC

## 2023-09-04 NOTE — Addendum Note (Signed)
 Addended by: Park Meo on: 09/04/2023 10:49 AM   Modules accepted: Orders

## 2023-09-04 NOTE — Assessment & Plan Note (Signed)

## 2023-09-04 NOTE — Progress Notes (Signed)
 Complete physical exam  Patient: Charles Campbell   DOB: 10/29/1960   63 y.o. Male  MRN: [id number]  Subjective:    Chief Complaint  Patient presents with   Follow-up    6 month f/u. Pt states he never received a call regarding his labs in September and did not start the Atorvastatin.     Charles Campbell is a 63 y.o. male who presents today for a complete physical exam. He reports consuming a general diet. The patient does not participate in regular exercise at present. Mr Stoklosa does enjoy biking and dancing. He generally feels well. He reports sleeping well. He does not have additional problems to discuss today.   The 10-year ASCVD risk score (Arnett DK, et al., 2019) is: 15.4%   Values used to calculate the score:     Age: 53 years     Sex: Male     Is Non-Hispanic African American: No     Diabetic: No     Tobacco smoker: No     Systolic Blood Pressure: 138 mmHg     Is BP treated: No     HDL Cholesterol: 42 mg/dL     Total Cholesterol: 212 mg/dL    Most recent fall risk assessment:    03/07/2023   11:07 AM  Fall Risk   Falls in the past year? 0  Number falls in past yr: 0  Injury with Fall? 0  Risk for fall due to : No Fall Risks     Most recent depression screenings:    03/07/2023   11:08 AM 09/04/2022    3:25 PM  PHQ 2/9 Scores  PHQ - 2 Score 0 2  PHQ- 9 Score 3 2    Vision:Not within last year , Dental: No current dental problems and No regular dental care , and PSA: Prostate cancer screening and PSA options (with potential risks and benefits of testing vs. not testing) were discussed along with recent recs/guidelines.   Patient Active Problem List   Diagnosis Date Noted   Impacted cerumen of right ear 09/04/2023   Overweight (BMI 25.0-29.9) 09/04/2022   Physical exam, annual 09/04/2022   Oral candidiasis 09/04/2022   Hammer toes, bilateral 10/23/2019   Tailor's bunion of left foot 10/23/2019   HLD (hyperlipidemia) 06/05/2019   Osteoarthritis 06/05/2019    Atrial fibrillation with RVR (HCC) 06/04/2019   Vertigo 11/28/2017   Syncope and collapse 07/02/2016   Environmental allergies 07/02/2016   Past Medical History:  Diagnosis Date   A-fib (HCC) 05/2019   Arthritis    Wrist   H/O hiatal hernia    watches diet   Hyperlipidemia    Physical exam, annual 09/04/2022   Past Surgical History:  Procedure Laterality Date   COLONOSCOPY  2009   FINGER SURGERY     Left index- due to injury   MULTIPLE EXTRACTIONS WITH ALVEOLOPLASTY  12/24/2011   Procedure: MULTIPLE EXTRACION WITH ALVEOLOPLASTY;  Surgeon: [name], DDS;  Location: [facility] OR;  Service: Oral Surgery;  Laterality: Bilateral;   Social History   Tobacco Use   Smoking status: Never   Smokeless tobacco: Never  Substance Use Topics   Alcohol use: No   Drug use: No   Family History  Problem Relation Age of Onset   Cancer Mother        lung   Asthma Mother    Heart disease Father 35       heart attack   Cancer Brother  brain   Cancer Brother    No Known Allergies    Patient Care Team: Park Meo, FNP as PCP - General (Family Medicine) Wyline Mood, Dorothe Pea, MD as PCP - Cardiology (Cardiology) Jena Gauss Gerrit Friends, MD as Consulting Physician (Gastroenterology)   Outpatient Medications Prior to Visit  Medication Sig   ascorbic acid (VITAMIN C) 500 MG tablet Take 500 mg by mouth daily.   aspirin EC 81 MG tablet Take 1 tablet (81 mg total) by mouth daily with breakfast.   Multiple Vitamins-Minerals (CENTRUM SILVER ULTRA MENS) TABS Take 1 tablet by mouth every morning.    nystatin (MYCOSTATIN) 100000 UNIT/ML suspension Take 5 mLs (500,000 Units total) by mouth 4 (four) times daily.   [DISCONTINUED] atorvastatin (LIPITOR) 10 MG tablet Take 1 tablet (10 mg total) by mouth daily. (Patient not taking: Reported on 09/04/2023)   No facility-administered medications prior to visit.    Review of Systems  Constitutional: Negative.   HENT: Negative.    Eyes: Negative.    Respiratory: Negative.    Cardiovascular: Negative.   Gastrointestinal: Negative.   Genitourinary: Negative.   Musculoskeletal: Negative.   Skin: Negative.   Neurological: Negative.   Endo/Heme/Allergies: Negative.   Psychiatric/Behavioral: Negative.    All other systems reviewed and are negative.         Objective:     BP 138/82   Pulse 92   Ht 6\' 5"  (1.956 m)   Wt 250 lb 9.6 oz (113.7 kg)   SpO2 98%   BMI 29.72 kg/m  BP Readings from Last 3 Encounters:  09/04/23 138/82  03/07/23 138/80  09/04/22 120/78   Wt Readings from Last 3 Encounters:  09/04/23 250 lb 9.6 oz (113.7 kg)  03/07/23 254 lb (115.2 kg)  09/04/22 242 lb (109.8 kg)      Physical Exam Vitals and nursing note reviewed.  Constitutional:      Appearance: Normal appearance. He is normal weight.  HENT:     Head: Normocephalic and atraumatic.     Right Ear: Tympanic membrane, ear canal and external ear normal. There is impacted cerumen.     Left Ear: Tympanic membrane, ear canal and external ear normal.     Nose: Nose normal.     Mouth/Throat:     Mouth: Mucous membranes are moist.     Pharynx: Oropharynx is clear.  Eyes:     Extraocular Movements: Extraocular movements intact.     Right eye: Normal extraocular motion and no nystagmus.     Left eye: Normal extraocular motion and no nystagmus.     Conjunctiva/sclera: Conjunctivae normal.     Pupils: Pupils are equal, round, and reactive to light.  Cardiovascular:     Rate and Rhythm: Normal rate and regular rhythm.     Pulses: Normal pulses.     Heart sounds: Murmur heard.  Pulmonary:     Effort: Pulmonary effort is normal.     Breath sounds: Normal breath sounds.  Abdominal:     General: Bowel sounds are normal.     Palpations: Abdomen is soft.  Genitourinary:    Comments: Deferred using shared decision making Musculoskeletal:        General: Normal range of motion.     Cervical back: Normal range of motion and neck supple.  Skin:     General: Skin is warm and dry.     Capillary Refill: Capillary refill takes less than 2 seconds.  Neurological:     General: No focal deficit  present.     Mental Status: He is alert. Mental status is at baseline.  Psychiatric:        Mood and Affect: Mood normal.        Speech: Speech normal.        Behavior: Behavior normal.        Thought Content: Thought content normal.        Cognition and Memory: Cognition and memory normal.        Judgment: Judgment normal.      No results found for any visits on 09/04/23. Last CBC Lab Results  Component Value Date   WBC 5.4 08/30/2023   HGB 16.2 08/30/2023   HCT 47.5 08/30/2023   MCV 88.3 08/30/2023   MCH 30.1 08/30/2023   RDW 13.0 08/30/2023   PLT 210 08/30/2023   Last metabolic panel Lab Results  Component Value Date   GLUCOSE 118 (H) 08/30/2023   NA 139 08/30/2023   K 4.3 08/30/2023   CL 103 08/30/2023   CO2 28 08/30/2023   BUN 17 08/30/2023   CREATININE 1.17 08/30/2023   EGFR 70 08/30/2023   CALCIUM 9.0 08/30/2023   PROT 6.6 08/30/2023   ALBUMIN 4.5 06/04/2019   BILITOT 0.5 08/30/2023   ALKPHOS 47 06/04/2019   AST 18 08/30/2023   ALT 21 08/30/2023   ANIONGAP 11 08/20/2022   Last lipids Lab Results  Component Value Date   CHOL 212 (H) 08/30/2023   HDL 42 08/30/2023   LDLCALC 139 (H) 08/30/2023   TRIG 174 (H) 08/30/2023   CHOLHDL 5.0 (H) 08/30/2023   Last hemoglobin A1c Lab Results  Component Value Date   HGBA1C 5.5 09/05/2022   Last thyroid functions Lab Results  Component Value Date   TSH 1.67 08/05/2019   Last vitamin D No results found for: "25OHVITD2", "25OHVITD3", "VD25OH" Last vitamin B12 and Folate No results found for: "VITAMINB12", "FOLATE"      Assessment & Plan:    Routine Health Maintenance and Physical Exam  Immunization History  Administered Date(s) Administered   Fluad Quad(high Dose 65+) 09/04/2022   Influenza, Seasonal, Injecte, Preservative Fre 03/07/2023   Influenza,inj,Quad  PF,6+ Mos 07/02/2016, 06/05/2019   Tdap 08/05/2019   Zoster Recombinant(Shingrix) 09/04/2022, 03/07/2023    Health Maintenance  Topic Date Due   COVID-19 Vaccine (1 - 2024-25 season) 09/20/2023 (Originally 02/17/2023)   Fecal DNA (Cologuard)  09/19/2025   DTaP/Tdap/Td (2 - Td or Tdap) 08/04/2029   INFLUENZA VACCINE  Completed   Hepatitis C Screening  Completed   HIV Screening  Completed   Zoster Vaccines- Shingrix  Completed   HPV VACCINES  Aged Out   Colonoscopy  Discontinued    Discussed health benefits of physical activity, and encouraged him to engage in regular exercise appropriate for his age and condition.  Problem List Items Addressed This Visit     HLD (hyperlipidemia)   Has not started Lipitor, Start Lipitor 10mg  daily and return for repeat CMP and lipids in 6 weeks. I recommend consuming a heart healthy diet such as Mediterranean diet or DASH diet with whole grains, fruits, vegetable, fish, lean meats, nuts, and olive oil. Limit sweets and processed foods. I also encourage moderate intensity exercise 150 minutes weekly. This is 3-5 times weekly for 30-50 minutes each session. Goal should be pace of 3 miles/hours, or walking 1.5 miles in 30 minutes. The 10-year ASCVD risk score (Arnett DK, et al., 2019) is: 15.4%        Relevant Medications  atorvastatin (LIPITOR) 10 MG tablet   Physical exam, annual - Primary   Today your medical history was reviewed and routine physical exam with labs was performed. Recommend 150 minutes of moderate intensity exercise weekly and consuming a well-balanced diet. Advised to stop smoking if a smoker, avoid smoking if a non-smoker, limit alcohol consumption to 1 drink per day for women and 2 drinks per day for men, and avoid illicit drug use. Counseled in mental health awareness and when to seek medical care. Vaccine maintenance discussed. Appropriate health maintenance items reviewed. Return to office in 1 year for annual physical exam.        Impacted cerumen of right ear   Successful disimpaction of cerumen from the right ear using water and hydrogen peroxide flush as well as curette.      Return in about 2 months (around 11/04/2023) for chronic follow-up with labs 1 week prior.     Park Meo, FNP

## 2023-09-04 NOTE — Assessment & Plan Note (Signed)
 Has not started Lipitor, Start Lipitor 10mg  daily and return for repeat CMP and lipids in 6 weeks. I recommend consuming a heart healthy diet such as Mediterranean diet or DASH diet with whole grains, fruits, vegetable, fish, lean meats, nuts, and olive oil. Limit sweets and processed foods. I also encourage moderate intensity exercise 150 minutes weekly. This is 3-5 times weekly for 30-50 minutes each session. Goal should be pace of 3 miles/hours, or walking 1.5 miles in 30 minutes. The 10-year ASCVD risk score (Arnett DK, et al., 2019) is: 15.4%

## 2023-09-04 NOTE — Assessment & Plan Note (Signed)
 Successful disimpaction of cerumen from the right ear using water and hydrogen peroxide flush as well as curette.

## 2023-09-06 LAB — COMPREHENSIVE METABOLIC PANEL
AG Ratio: 1.8 (calc) (ref 1.0–2.5)
ALT: 21 U/L (ref 9–46)
AST: 18 U/L (ref 10–35)
Albumin: 4.2 g/dL (ref 3.6–5.1)
Alkaline phosphatase (APISO): 53 U/L (ref 35–144)
BUN: 17 mg/dL (ref 7–25)
CO2: 28 mmol/L (ref 20–32)
Calcium: 9 mg/dL (ref 8.6–10.3)
Chloride: 103 mmol/L (ref 98–110)
Creat: 1.17 mg/dL (ref 0.70–1.35)
Globulin: 2.4 g/dL (ref 1.9–3.7)
Glucose, Bld: 118 mg/dL — ABNORMAL HIGH (ref 65–99)
Potassium: 4.3 mmol/L (ref 3.5–5.3)
Sodium: 139 mmol/L (ref 135–146)
Total Bilirubin: 0.5 mg/dL (ref 0.2–1.2)
Total Protein: 6.6 g/dL (ref 6.1–8.1)
eGFR: 70 mL/min/{1.73_m2} (ref >=60)

## 2023-09-06 LAB — LIPID PANEL
Cholesterol: 212 mg/dL — ABNORMAL HIGH (ref <200)
HDL: 42 mg/dL (ref >=40)
LDL Cholesterol (Calc): 139 mg/dL — ABNORMAL HIGH
Non-HDL Cholesterol (Calc): 170 mg/dL — ABNORMAL HIGH (ref <130)
Total CHOL/HDL Ratio: 5 (calc) — ABNORMAL HIGH (ref <5.0)
Triglycerides: 174 mg/dL — ABNORMAL HIGH (ref <150)

## 2023-09-06 LAB — CBC WITH DIFFERENTIAL/PLATELET
Absolute Lymphocytes: 1868 {cells}/uL (ref 850–3900)
Absolute Monocytes: 281 {cells}/uL (ref 200–950)
Basophils Absolute: 38 {cells}/uL (ref 0–200)
Basophils Relative: 0.7 %
Eosinophils Absolute: 232 {cells}/uL (ref 15–500)
Eosinophils Relative: 4.3 %
HCT: 47.5 % (ref 38.5–50.0)
Hemoglobin: 16.2 g/dL (ref 13.2–17.1)
MCH: 30.1 pg (ref 27.0–33.0)
MCHC: 34.1 g/dL (ref 32.0–36.0)
MCV: 88.3 fL (ref 80.0–100.0)
MPV: 11.1 fL (ref 7.5–12.5)
Monocytes Relative: 5.2 %
Neutro Abs: 2981 {cells}/uL (ref 1500–7800)
Neutrophils Relative %: 55.2 %
Platelets: 210 10*3/uL (ref 140–400)
RBC: 5.38 10*6/uL (ref 4.20–5.80)
RDW: 13 % (ref 11.0–15.0)
Total Lymphocyte: 34.6 %
WBC: 5.4 10*3/uL (ref 3.8–10.8)

## 2023-09-06 LAB — HEMOGLOBIN A1C
Hgb A1c MFr Bld: 5.5 %{Hb} (ref <5.7)
Mean Plasma Glucose: 111 mg/dL
eAG (mmol/L): 6.2 mmol/L

## 2023-09-06 LAB — TEST AUTHORIZATION

## 2023-09-06 LAB — PSA: PSA: 0.63 ng/mL (ref <=4.00)

## 2023-09-28 DIAGNOSIS — Z419 Encounter for procedure for purposes other than remedying health state, unspecified: Secondary | ICD-10-CM | POA: Diagnosis not present

## 2023-10-28 DIAGNOSIS — Z419 Encounter for procedure for purposes other than remedying health state, unspecified: Secondary | ICD-10-CM | POA: Diagnosis not present

## 2023-11-01 ENCOUNTER — Other Ambulatory Visit

## 2023-11-01 DIAGNOSIS — E785 Hyperlipidemia, unspecified: Secondary | ICD-10-CM

## 2023-11-01 DIAGNOSIS — I4891 Unspecified atrial fibrillation: Secondary | ICD-10-CM | POA: Diagnosis not present

## 2023-11-01 DIAGNOSIS — E663 Overweight: Secondary | ICD-10-CM

## 2023-11-01 DIAGNOSIS — E7800 Pure hypercholesterolemia, unspecified: Secondary | ICD-10-CM

## 2023-11-01 DIAGNOSIS — Z0000 Encounter for general adult medical examination without abnormal findings: Secondary | ICD-10-CM

## 2023-11-01 LAB — CBC WITH DIFFERENTIAL/PLATELET
Absolute Lymphocytes: 2063 {cells}/uL (ref 850–3900)
Absolute Monocytes: 389 {cells}/uL (ref 200–950)
Basophils Absolute: 49 {cells}/uL (ref 0–200)
Basophils Relative: 0.9 %
Eosinophils Absolute: 200 {cells}/uL (ref 15–500)
Eosinophils Relative: 3.7 %
HCT: 51.4 % — ABNORMAL HIGH (ref 38.5–50.0)
Hemoglobin: 16.6 g/dL (ref 13.2–17.1)
MCH: 29.9 pg (ref 27.0–33.0)
MCHC: 32.3 g/dL (ref 32.0–36.0)
MCV: 92.6 fL (ref 80.0–100.0)
MPV: 11.1 fL (ref 7.5–12.5)
Monocytes Relative: 7.2 %
Neutro Abs: 2700 {cells}/uL (ref 1500–7800)
Neutrophils Relative %: 50 %
Platelets: 230 10*3/uL (ref 140–400)
RBC: 5.55 10*6/uL (ref 4.20–5.80)
RDW: 13.2 % (ref 11.0–15.0)
Total Lymphocyte: 38.2 %
WBC: 5.4 10*3/uL (ref 3.8–10.8)

## 2023-11-01 LAB — LIPID PANEL
Cholesterol: 156 mg/dL (ref <200)
HDL: 41 mg/dL (ref >=40)
LDL Cholesterol (Calc): 94 mg/dL
Non-HDL Cholesterol (Calc): 115 mg/dL (ref <130)
Total CHOL/HDL Ratio: 3.8 (calc) (ref <5.0)
Triglycerides: 115 mg/dL (ref <150)

## 2023-11-01 LAB — COMPREHENSIVE METABOLIC PANEL WITH GFR
AG Ratio: 2 (calc) (ref 1.0–2.5)
ALT: 26 U/L (ref 9–46)
AST: 21 U/L (ref 10–35)
Albumin: 4.5 g/dL (ref 3.6–5.1)
Alkaline phosphatase (APISO): 54 U/L (ref 35–144)
BUN: 20 mg/dL (ref 7–25)
CO2: 28 mmol/L (ref 20–32)
Calcium: 9.4 mg/dL (ref 8.6–10.3)
Chloride: 103 mmol/L (ref 98–110)
Creat: 1.32 mg/dL (ref 0.70–1.35)
Globulin: 2.3 g/dL (ref 1.9–3.7)
Glucose, Bld: 100 mg/dL — ABNORMAL HIGH (ref 65–99)
Potassium: 4.4 mmol/L (ref 3.5–5.3)
Sodium: 139 mmol/L (ref 135–146)
Total Bilirubin: 0.7 mg/dL (ref 0.2–1.2)
Total Protein: 6.8 g/dL (ref 6.1–8.1)
eGFR: 60 mL/min/{1.73_m2} (ref >=60)

## 2023-11-05 ENCOUNTER — Encounter: Payer: Self-pay | Admitting: Family Medicine

## 2023-11-05 ENCOUNTER — Ambulatory Visit (INDEPENDENT_AMBULATORY_CARE_PROVIDER_SITE_OTHER): Admitting: Family Medicine

## 2023-11-05 VITALS — BP 131/70 | HR 88 | Ht 77.0 in | Wt 248.2 lb

## 2023-11-05 DIAGNOSIS — E663 Overweight: Secondary | ICD-10-CM | POA: Diagnosis not present

## 2023-11-05 DIAGNOSIS — L989 Disorder of the skin and subcutaneous tissue, unspecified: Secondary | ICD-10-CM | POA: Insufficient documentation

## 2023-11-05 DIAGNOSIS — E785 Hyperlipidemia, unspecified: Secondary | ICD-10-CM | POA: Diagnosis not present

## 2023-11-05 NOTE — Assessment & Plan Note (Signed)
 2x 0.5cm lesions to left doral hand with white scaling concerning for SCC. Will refer to dermatology. Encouraged to use sun block.

## 2023-11-05 NOTE — Assessment & Plan Note (Signed)
 Continue Lipitor 10mg  daily. I recommend consuming a heart healthy diet such as Mediterranean diet or DASH diet with whole grains, fruits, vegetable, fish, lean meats, nuts, and olive oil. Limit sweets and processed foods. I also encourage moderate intensity exercise 150 minutes weekly. This is 3-5 times weekly for 30-50 minutes each session. Goal should be pace of 3 miles/hours, or walking 1.5 miles in 30 minutes. The 10-year ASCVD risk score (Arnett DK, et al., 2019) is: 11.5%  Follow up in 4-6 months

## 2023-11-05 NOTE — Progress Notes (Signed)
 Subjective:  HPI: Charles Campbell is a 63 y.o. male presenting on 11/05/2023 for Medical Management of Chronic Issues (2 month chronic f/u )   HPI Patient is in today for follow up for hyperlipidemia. Charles Campbell is taking Atorvastatin  10mg  daily. Has been riding his bike for exercise, rode 40 miles between yesterday and today. Has also reduced intake of sweets and fats. Charles Campbell does have 2 white lesions to the back of his left hand that have been present for several months. No injury and they are not red or draining.   HYPERLIPIDEMIA Hyperlipidemia status: excellent compliance Satisfied with current treatment?  yes Side effects:  no Medication compliance: excellent compliance Past cholesterol meds: atorvastatin  Supplements: none Aspirin :  yes The 10-year ASCVD risk score (Arnett DK, et al., 2019) is: 11.5%   Values used to calculate the score:     Age: 20 years     Sex: Male     Is Non-Hispanic African American: No     Diabetic: No     Tobacco smoker: No     Systolic Blood Pressure: 131 mmHg     Is BP treated: No     HDL Cholesterol: 41 mg/dL     Total Cholesterol: 156 mg/dL Chest pain:  no Coronary artery disease:  no Family history CAD:  no Family history early CAD:  no   Review of Systems  All other systems reviewed and are negative.   Relevant past medical history reviewed and updated as indicated.   Past Medical History:  Diagnosis Date   A-fib (HCC) 05/2019   Arthritis    Wrist   H/O hiatal hernia    watches diet   Hyperlipidemia    Physical exam, annual 09/04/2022     Past Surgical History:  Procedure Laterality Date   COLONOSCOPY  2009   FINGER SURGERY     Left index- due to injury   MULTIPLE EXTRACTIONS WITH ALVEOLOPLASTY  12/24/2011   Procedure: MULTIPLE EXTRACION WITH ALVEOLOPLASTY;  Surgeon: Cornelia Dieter, DDS;  Location: [facility] OR;  Service: Oral Surgery;  Laterality: Bilateral;    Allergies and medications reviewed and updated.   Current  Outpatient Medications:    ascorbic acid (VITAMIN C) 500 MG tablet, Take 500 mg by mouth daily., Disp: , Rfl:    aspirin  EC 81 MG tablet, Take 1 tablet (81 mg total) by mouth daily with breakfast., Disp: 30 tablet, Rfl: 5   atorvastatin  (LIPITOR) 10 MG tablet, Take 1 tablet (10 mg total) by mouth daily., Disp: 90 tablet, Rfl: 3   Multiple Vitamins-Minerals (CENTRUM SILVER ULTRA MENS) TABS, Take 1 tablet by mouth every morning. , Disp: , Rfl:    nystatin  (MYCOSTATIN ) 100000 UNIT/ML suspension, Take 5 mLs (500,000 Units total) by mouth 4 (four) times daily., Disp: 60 mL, Rfl: 0  No Known Allergies  Objective:   BP 131/70   Pulse 88   Ht 6\' 5"  (1.956 m)   Wt 248 lb 3.2 oz (112.6 kg)   SpO2 97%   BMI 29.43 kg/m      11/05/2023    9:32 AM 09/04/2023    9:56 AM 03/07/2023   10:09 AM  Vitals with BMI  Height 6\' 5"  6\' 5"    Weight 248 lbs 3 oz 250 lbs 10 oz   BMI 29.43 29.71   Systolic 131 138 161  Diastolic 70 82 80  Pulse 88 92      Physical Exam Vitals and nursing note reviewed.  Constitutional:  Appearance: Normal appearance. He is normal weight.  HENT:     Head: Normocephalic and atraumatic.  Cardiovascular:     Rate and Rhythm: Normal rate and regular rhythm.     Pulses: Normal pulses.     Heart sounds: Normal heart sounds.  Pulmonary:     Effort: Pulmonary effort is normal.     Breath sounds: Normal breath sounds.  Skin:    General: Skin is warm and dry.     Capillary Refill: Capillary refill takes less than 2 seconds.     Findings: Lesion present.          Comments: 2 x 0.5cm scaling lesions to dorsal left hand  Neurological:     General: No focal deficit present.     Mental Status: He is alert and oriented to person, place, and time. Mental status is at baseline.  Psychiatric:        Mood and Affect: Mood normal.        Behavior: Behavior normal.        Thought Content: Thought content normal.        Judgment: Judgment normal.     Assessment & Plan:   Hyperlipidemia, unspecified hyperlipidemia type Assessment & Plan: Continue Lipitor 10mg  daily. I recommend consuming a heart healthy diet such as Mediterranean diet or DASH diet with whole grains, fruits, vegetable, fish, lean meats, nuts, and olive oil. Limit sweets and processed foods. I also encourage moderate intensity exercise 150 minutes weekly. This is 3-5 times weekly for 30-50 minutes each session. Goal should be pace of 3 miles/hours, or walking 1.5 miles in 30 minutes. The 10-year ASCVD risk score (Arnett DK, et al., 2019) is: 11.5%  Follow up in 4-6 months  Orders: -     Comprehensive metabolic panel with GFR; Standing -     Lipid panel; Standing -     CBC with Differential/Platelet; Standing -     Hemoglobin A1c; Standing  Overweight (BMI 25.0-29.9) Assessment & Plan: Working on diet and exercise.  Orders: -     Comprehensive metabolic panel with GFR; Standing -     Lipid panel; Standing -     CBC with Differential/Platelet; Standing -     Hemoglobin A1c; Standing  Skin lesion of hand Assessment & Plan: 2x 0.5cm lesions to left doral hand with white scaling concerning for SCC. Will refer to dermatology. Encouraged to use sun block.  Orders: -     Ambulatory referral to Dermatology     Follow up plan: Return in about 6 months (around 05/07/2024) for chronic follow-up with labs 1 week prior.  Jenelle Mis, FNP

## 2023-11-05 NOTE — Assessment & Plan Note (Signed)
 Working on diet and exercise

## 2023-11-13 ENCOUNTER — Other Ambulatory Visit: Payer: Self-pay

## 2023-11-13 ENCOUNTER — Emergency Department (HOSPITAL_COMMUNITY)
Admission: EM | Admit: 2023-11-13 | Discharge: 2023-11-13 | Disposition: A | Discharge status: Home / Self Care | Attending: Emergency Medicine | Admitting: Emergency Medicine

## 2023-11-13 ENCOUNTER — Encounter (HOSPITAL_COMMUNITY): Payer: Self-pay

## 2023-11-13 DIAGNOSIS — S80861A Insect bite (nonvenomous), right lower leg, initial encounter: Secondary | ICD-10-CM | POA: Insufficient documentation

## 2023-11-13 DIAGNOSIS — Z7982 Long term (current) use of aspirin: Secondary | ICD-10-CM | POA: Insufficient documentation

## 2023-11-13 DIAGNOSIS — W57XXXA Bitten or stung by nonvenomous insect and other nonvenomous arthropods, initial encounter: Secondary | ICD-10-CM | POA: Diagnosis not present

## 2023-11-13 MED ORDER — DOXYCYCLINE HYCLATE 100 MG PO CAPS
100.0000 mg | ORAL_CAPSULE | Freq: Two times a day (BID) | ORAL | 0 refills | Status: AC
Start: 2023-11-13 — End: ?

## 2023-11-13 NOTE — ED Triage Notes (Signed)
 Pt arrived via POV c/o tick bite in right lower leg. This RN removed the tick for the Pt and Pt requesting quick evaluation by EDP and possibly an antibiotic.

## 2023-11-13 NOTE — ED Provider Notes (Signed)
 [place] EMERGENCY DEPARTMENT AT [facility] Provider Note   CSN: [id number] Arrival date & time: 11/13/23  1457     History  Chief Complaint  Patient presents with   Insect Bite    Charles Campbell is a 63 y.o. male presenting with complaint of tick bite to his right medial upper leg which he first noticed today.  He mows several yards which is probably how he picked up this tick.  Suspects it has been embedded for more than 24 hours. Itching around the site.  He denies fevers, chills, rash, n/v, headache or other complaint.  The history is provided by the patient.       Home Medications Prior to Admission medications   Medication Sig Start Date End Date Taking? Authorizing Provider  doxycycline  (VIBRAMYCIN ) 100 MG capsule Take 1 capsule (100 mg total) by mouth 2 (two) times daily. 11/13/23  Yes Clarine Elrod, Concha Deed, PA-C  ascorbic acid (VITAMIN C) 500 MG tablet Take 500 mg by mouth daily.    [provider]  aspirin  EC 81 MG tablet Take 1 tablet (81 mg total) by mouth daily with breakfast. 06/05/19   Emokpae, Courage, MD  atorvastatin  (LIPITOR) 10 MG tablet Take 1 tablet (10 mg total) by mouth daily. 09/04/23   Jenelle Mis, FNP  Multiple Vitamins-Minerals (CENTRUM SILVER ULTRA MENS) TABS Take 1 tablet by mouth every morning.     [provider]  nystatin  (MYCOSTATIN ) 100000 UNIT/ML suspension Take 5 mLs (500,000 Units total) by mouth 4 (four) times daily. 09/04/23   Jenelle Mis, FNP      Allergies    Patient has no known allergies.    Review of Systems   Review of Systems  Constitutional:  Negative for chills and fever.  Respiratory:  Negative for shortness of breath and wheezing.   Skin:  Positive for wound. Negative for color change and rash.  Neurological:  Negative for numbness.  All other systems reviewed and are negative.   Physical Exam Updated Vital Signs BP (!) 162/83 (BP Location: Right Arm)   Pulse 64   Temp 97.6 F (36.4 C)  (Oral)   Resp 18   Ht 6\' 5"  (1.956 m)   Wt 112.5 kg   SpO2 99%   BMI 29.41 kg/m  Physical Exam Constitutional:      General: He is not in acute distress.    Appearance: He is well-developed.  HENT:     Head: Normocephalic.  Cardiovascular:     Rate and Rhythm: Normal rate.  Pulmonary:     Effort: Pulmonary effort is normal.     Breath sounds: No wheezing.  Musculoskeletal:        General: Normal range of motion.     Cervical back: Neck supple.  Skin:    Findings: Erythema present. No rash.     Comments: Small area of erythema right posterior upper calf.  Tick is present in a urine container (removed by RN in triage).  Tiny deer tick.  No bullseye lesion, no rash.      ED Results / Procedures / Treatments   Labs (all labs ordered are listed, but only abnormal results are displayed) Labs Reviewed - No data to display  EKG None  Radiology No results found.  Procedures Procedures    Medications Ordered in ED Medications - No data to display  ED Course/ Medical Decision Making/ A&P  Medical Decision Making Pt with tick exposure which appears to have been significantly embedded per photos pt shares on phone.  No obvious signs of tick illness, but will cover with doxycycline. Discussed sun sensitivity while on this med and to protect skin.  Prn f/u with pcp.   Risk Prescription drug management.           Final Clinical Impression(s) / ED Diagnoses Final diagnoses:  Tick bite of right lower leg, initial encounter    Rx / DC Orders ED Discharge Orders          Ordered    doxycycline (VIBRAMYCIN) 100 MG capsule  2 times daily        11/13/23 1702              Kellyanne Ellwanger, PA-C 11/13/23 1833    Zammit, Joseph, MD 11/15/23 [phone]

## 2023-11-13 NOTE — Discharge Instructions (Signed)
 You are being covered for the possibility of tick bite infection with the antibiotic prescribed.  Take the entire course of this medicine.  Make sure you are protecting your skin from sun while on this medicine as it can increase your sensitivity and make you more easy to sunburn. Wear a hat and use sunscreen while outdoors.

## 2023-11-28 DIAGNOSIS — Z419 Encounter for procedure for purposes other than remedying health state, unspecified: Secondary | ICD-10-CM | POA: Diagnosis not present

## 2023-12-17 ENCOUNTER — Encounter: Payer: Self-pay | Admitting: Physician Assistant

## 2023-12-17 ENCOUNTER — Ambulatory Visit: Admitting: Physician Assistant

## 2023-12-17 DIAGNOSIS — L570 Actinic keratosis: Secondary | ICD-10-CM

## 2023-12-17 DIAGNOSIS — D1801 Hemangioma of skin and subcutaneous tissue: Secondary | ICD-10-CM | POA: Diagnosis not present

## 2023-12-17 DIAGNOSIS — D229 Melanocytic nevi, unspecified: Secondary | ICD-10-CM

## 2023-12-17 DIAGNOSIS — W908XXA Exposure to other nonionizing radiation, initial encounter: Secondary | ICD-10-CM

## 2023-12-17 DIAGNOSIS — L821 Other seborrheic keratosis: Secondary | ICD-10-CM

## 2023-12-17 DIAGNOSIS — L578 Other skin changes due to chronic exposure to nonionizing radiation: Secondary | ICD-10-CM | POA: Diagnosis not present

## 2023-12-17 NOTE — Patient Instructions (Signed)

## 2023-12-17 NOTE — Progress Notes (Signed)
   New Patient Visit   Subjective  Charles Campbell is a 63 y.o. male who presents for the following: New Pt - Spot Check   Patient stated he is here for a spot check of multiples white spots that his PCP noticed at a follow up visit 67mo ago that she was concerned about so she placed a referral to dermatology. Areas are not itchy or bothersome. No Hx of Bx. Denied family Hx of skin cancer.    The following portions of the chart were reviewed this encounter and updated as appropriate: medications, allergies, medical history  Review of Systems:  No other skin or systemic complaints except as noted in HPI or Assessment and Plan.  Objective  Well appearing patient in no apparent distress; mood and affect are within normal limits.   A focused examination was performed of the following areas: upper body exam   Relevant exam findings are noted in the Assessment and Plan.  Left Hand, Right hand, L cheek (15) Erythematous thin papules/macules with gritty scale.   Assessment & Plan   ACTINIC DAMAGE W/ ACTINIC KERATOSIS Exam: Erythematous thin papules/macules with gritty scale  Actinic keratoses are precancerous spots that appear secondary to cumulative UV radiation exposure/sun exposure over time. They are chronic with expected duration over 1 year. A portion of actinic keratoses will progress to squamous cell carcinoma of the skin. It is not possible to reliably predict which spots will progress to skin cancer and so treatment is recommended to prevent development of skin cancer.  Recommend daily broad spectrum sunscreen SPF 30+ to sun-exposed areas, reapply every 2 hours as needed.  Recommend staying in the shade or wearing long sleeves, sun glasses (UVA+UVB protection) and wide brim hats (4-inch brim around the entire circumference of the hat). Call for new or changing lesions.  Treatment Plan:  Prior to procedure, discussed risks of blister formation, small wound, skin dyspigmentation,  or rare scar following cryotherapy. Recommend Vaseline ointment to treated areas while healing.  HEMANGIOMA Exam: red papule(s) scattered at the chest Discussed benign nature. Recommend observation. Call for changes.  SEBORRHEIC KERATOSIS - Stuck-on, waxy, tan-brown papules and/or plaques  - Benign-appearing - Discussed benign etiology and prognosis. - Observe - Call for any changes  MELANOCYTIC NEVI Exam: Tan-brown and/or pink-flesh-colored symmetric macules and papules  Treatment Plan: Benign appearing on exam today. Recommend observation. Call clinic for new or changing moles. Recommend daily use of broad spectrum spf 30+ sunscreen to sun-exposed areas.    AK (ACTINIC KERATOSIS) (15) Left Hand, Right hand, L cheek (15) Destruction of lesion - Left Hand, Right hand, L cheek (15) Complexity: simple   Destruction method: cryotherapy   Informed consent: discussed and consent obtained   Timeout:  patient name, date of birth, surgical site, and procedure verified Lesion destroyed using liquid nitrogen: Yes   Post-procedure details: wound care instructions given   ACTINIC SKIN DAMAGE   CHERRY ANGIOMA   SEBORRHEIC KERATOSIS   MULTIPLE BENIGN NEVI    Return in about 6 months (around 06/18/2024) for TBSE.   Documentation: I have reviewed the above documentation for accuracy and completeness, and I agree with the above.  I, Shirron Maranda, CMA, am acting as Neurosurgeon for [employer], PA-C.   Judia Arnott K, PA-C

## 2023-12-28 DIAGNOSIS — Z419 Encounter for procedure for purposes other than remedying health state, unspecified: Secondary | ICD-10-CM | POA: Diagnosis not present

## 2024-01-28 DIAGNOSIS — Z419 Encounter for procedure for purposes other than remedying health state, unspecified: Secondary | ICD-10-CM | POA: Diagnosis not present

## 2024-02-28 DIAGNOSIS — Z419 Encounter for procedure for purposes other than remedying health state, unspecified: Secondary | ICD-10-CM | POA: Diagnosis not present

## 2024-03-09 ENCOUNTER — Encounter: Payer: Self-pay | Admitting: Family Medicine

## 2024-03-09 ENCOUNTER — Ambulatory Visit: Admitting: Family Medicine

## 2024-03-09 VITALS — BP 135/72 | HR 80 | Temp 98.1°F | Ht 77.0 in | Wt 247.0 lb

## 2024-03-09 DIAGNOSIS — H6121 Impacted cerumen, right ear: Secondary | ICD-10-CM | POA: Diagnosis not present

## 2024-03-09 DIAGNOSIS — L570 Actinic keratosis: Secondary | ICD-10-CM

## 2024-03-09 NOTE — Assessment & Plan Note (Addendum)
 Followed by Elita. S/P cryotherapy. Healing well.

## 2024-03-09 NOTE — Progress Notes (Signed)
 Subjective:  HPI: Charles Campbell is a 63 y.o. male presenting on 03/09/2024 for Follow-up (F/u for skin tags )   HPI Patient is in today for follow up for skin tags. Mr Mcaffee recently established with Dermatology and had cryotherapy performed for actinic keratosis on his hands and left cheek. These are healing well and he has no concerns about these today. No new concerns or medication changes. Continues to exercise regularly and eat a heart healthy diet. Is working on avoiding sweets. A1c and lipids were normal. CPE due in March and will recheck.  Other concerns include impacted cerumen of his right ear.   Ear Cerumen Removal  Date/Time: 03/09/2024 11:43 AM  Performed by: Kayla Jeoffrey RAMAN, FNP Authorized by: Kayla Jeoffrey RAMAN, FNP   Anesthesia: Local Anesthetic: none Location details: right ear Patient tolerance: patient tolerated the procedure well with no immediate complications Procedure type: irrigation  Sedation: Patient sedated: no       Lipid Panel     Component Value Date/Time   CHOL 156 11/01/2023 0901   TRIG 115 11/01/2023 0901   HDL 41 11/01/2023 0901   CHOLHDL 3.8 11/01/2023 0901   LDLCALC 94 11/01/2023 0901   Lab Results  Component Value Date   HGBA1C 5.5 08/30/2023   HGBA1C 5.5 09/05/2022   HGBA1C 5.2 08/05/2019     Review of Systems  Respiratory: Negative.    Cardiovascular: Negative.   Gastrointestinal: Negative.   Neurological: Negative.   All other systems reviewed and are negative.   Relevant past medical history reviewed and updated as indicated.   Past Medical History:  Diagnosis Date   A-fib (HCC) 05/2019   Arthritis    Wrist   H/O hiatal hernia    watches diet   Hyperlipidemia    Physical exam, annual 09/04/2022     Past Surgical History:  Procedure Laterality Date   COLONOSCOPY  2009   FINGER SURGERY     Left index- due to injury   MULTIPLE EXTRACTIONS WITH ALVEOLOPLASTY  12/24/2011   Procedure: MULTIPLE EXTRACION WITH  ALVEOLOPLASTY;  Surgeon: Glendia CHRISTELLA Primrose, DDS;  Location: [facility] OR;  Service: Oral Surgery;  Laterality: Bilateral;    Allergies and medications reviewed and updated.   Current Outpatient Medications:    ascorbic acid (VITAMIN C) 500 MG tablet, Take 500 mg by mouth daily., Disp: , Rfl:    aspirin  EC 81 MG tablet, Take 1 tablet (81 mg total) by mouth daily with breakfast., Disp: 30 tablet, Rfl: 5   atorvastatin  (LIPITOR) 10 MG tablet, Take 1 tablet (10 mg total) by mouth daily., Disp: 90 tablet, Rfl: 3   doxycycline  (VIBRAMYCIN ) 100 MG capsule, Take 1 capsule (100 mg total) by mouth 2 (two) times daily., Disp: 20 capsule, Rfl: 0   Multiple Vitamins-Minerals (CENTRUM SILVER ULTRA MENS) TABS, Take 1 tablet by mouth every morning. , Disp: , Rfl:    nystatin  (MYCOSTATIN ) 100000 UNIT/ML suspension, Take 5 mLs (500,000 Units total) by mouth 4 (four) times daily., Disp: 60 mL, Rfl: 0  No Known Allergies  Objective:   BP 135/72   Pulse 80   Temp 98.1 F (36.7 C)   Ht 6' 5 (1.956 m)   Wt 247 lb (112 kg)   SpO2 98%   BMI 29.29 kg/m      03/09/2024   10:24 AM 11/13/2023    4:14 PM 11/13/2023    4:11 PM  Vitals with BMI  Height 6' 5 6' 5   Weight  247 lbs 248 lbs   BMI 29.28 29.4   Systolic 135  162  Diastolic 72  83  Pulse 80  64     Physical Exam Vitals and nursing note reviewed.  Constitutional:      Appearance: Normal appearance. He is normal weight.  HENT:     Head: Normocephalic and atraumatic.     Right Ear: Ear canal and external ear normal. There is impacted cerumen.     Left Ear: Tympanic membrane, ear canal and external ear normal.  Cardiovascular:     Rate and Rhythm: Normal rate and regular rhythm.     Pulses: Normal pulses.     Heart sounds: Normal heart sounds.  Pulmonary:     Effort: Pulmonary effort is normal.     Breath sounds: Normal breath sounds.  Skin:    General: Skin is warm and dry.     Capillary Refill: Capillary refill takes less than 2 seconds.   Neurological:     General: No focal deficit present.     Mental Status: He is alert and oriented to person, place, and time. Mental status is at baseline.  Psychiatric:        Mood and Affect: Mood normal.        Behavior: Behavior normal.        Thought Content: Thought content normal.        Judgment: Judgment normal.     Assessment & Plan:  Impacted cerumen of right ear Assessment & Plan: Unable to completely disimpact cerumen from the right ear using water and hydrogen peroxide flush. Recommended debrox and follow up PRN.    Actinic keratosis Assessment & Plan: Followed by Elita. S/P cryotherapy. Healing well.    Other orders -     Ear Cerumen Removal     Follow up plan: Return in about 6 months (around 09/06/2024) for annual physical with labs 1 week prior.  Jeoffrey GORMAN Barrio, FNP

## 2024-03-09 NOTE — Assessment & Plan Note (Signed)
 Unable to completely disimpact cerumen from the right ear using water and hydrogen peroxide flush. Recommended debrox and follow up PRN.

## 2024-03-29 DIAGNOSIS — Z419 Encounter for procedure for purposes other than remedying health state, unspecified: Secondary | ICD-10-CM | POA: Diagnosis not present

## 2024-04-06 ENCOUNTER — Telehealth: Payer: Self-pay

## 2024-04-06 ENCOUNTER — Other Ambulatory Visit: Payer: Self-pay

## 2024-04-06 MED ORDER — ATORVASTATIN CALCIUM 10 MG PO TABS: 10.0000 mg | ORAL_TABLET | Freq: Every day | ORAL | 3 refills | Status: DC

## 2024-04-06 NOTE — Telephone Encounter (Signed)
 Sent in medication

## 2024-04-06 NOTE — Telephone Encounter (Signed)
 Prescription Request  04/06/2024  LOV: 03/09/24  What is the name of the medication or equipment? atorvastatin  (LIPITOR) 10 MG tablet [[id number]]   Have you contacted your pharmacy to request a refill? Yes   Which pharmacy would you like this sent to?  [facility] - [place], [place] - [address] [place] [address] Phone: [phone] Fax: [phone]  [facility] - [place], [place] - [address] [place] [address] Phone: [phone] Fax: [phone]    Patient notified that their request is being sent to the clinical staff for review and that they should receive a response within 2 business days.   Please advise at [facility] [phone]

## 2024-04-29 DIAGNOSIS — Z419 Encounter for procedure for purposes other than remedying health state, unspecified: Secondary | ICD-10-CM | POA: Diagnosis not present

## 2024-05-18 ENCOUNTER — Other Ambulatory Visit: Payer: Self-pay

## 2024-05-18 ENCOUNTER — Encounter: Payer: Self-pay | Admitting: Family Medicine

## 2024-05-18 MED ORDER — ATORVASTATIN CALCIUM 10 MG PO TABS: 10.0000 mg | ORAL_TABLET | Freq: Every day | ORAL | 3 refills | Status: AC

## 2024-05-29 DIAGNOSIS — Z419 Encounter for procedure for purposes other than remedying health state, unspecified: Secondary | ICD-10-CM | POA: Diagnosis not present

## 2024-06-23 ENCOUNTER — Ambulatory Visit: Admitting: Dermatology

## 2024-06-23 ENCOUNTER — Encounter: Payer: Self-pay | Admitting: Physician Assistant

## 2024-06-23 ENCOUNTER — Ambulatory Visit (INDEPENDENT_AMBULATORY_CARE_PROVIDER_SITE_OTHER): Admitting: Physician Assistant

## 2024-06-23 VITALS — BP 139/89

## 2024-06-23 DIAGNOSIS — W908XXA Exposure to other nonionizing radiation, initial encounter: Secondary | ICD-10-CM | POA: Diagnosis not present

## 2024-06-23 DIAGNOSIS — L814 Other melanin hyperpigmentation: Secondary | ICD-10-CM | POA: Diagnosis not present

## 2024-06-23 DIAGNOSIS — L578 Other skin changes due to chronic exposure to nonionizing radiation: Secondary | ICD-10-CM

## 2024-06-23 DIAGNOSIS — L570 Actinic keratosis: Secondary | ICD-10-CM

## 2024-06-23 DIAGNOSIS — D1801 Hemangioma of skin and subcutaneous tissue: Secondary | ICD-10-CM

## 2024-06-23 DIAGNOSIS — D485 Neoplasm of uncertain behavior of skin: Secondary | ICD-10-CM | POA: Diagnosis not present

## 2024-06-23 DIAGNOSIS — L821 Other seborrheic keratosis: Secondary | ICD-10-CM | POA: Diagnosis not present

## 2024-06-23 DIAGNOSIS — Z1283 Encounter for screening for malignant neoplasm of skin: Secondary | ICD-10-CM

## 2024-06-23 DIAGNOSIS — D225 Melanocytic nevi of trunk: Secondary | ICD-10-CM | POA: Diagnosis not present

## 2024-06-23 DIAGNOSIS — D229 Melanocytic nevi, unspecified: Secondary | ICD-10-CM

## 2024-06-23 NOTE — Progress Notes (Signed)
 "  Follow-Up Visit   Subjective  Charles Campbell is a 64 y.o. male ESTABLISHED PATIENT who presents for the following: Skin Cancer Screening and Full Body Skin Exam - No history of skin cancer  The patient presents for Total-Body Skin Exam (TBSE) for skin cancer screening and mole check. The patient has spots, moles and lesions to be evaluated, some may be new or changing and the patient may have concern these could be cancer.    The following portions of the chart were reviewed this encounter and updated as appropriate: medications, allergies, medical history  Review of Systems:  No other skin or systemic complaints except as noted in HPI or Assessment and Plan.  Objective  Well appearing patient in no apparent distress; mood and affect are within normal limits.  A full examination was performed including scalp, head, eyes, ears, nose, lips, neck, chest, axillae, abdomen, back, buttocks, bilateral upper extremities, bilateral lower extremities, hands, feet, fingers, toes, fingernails, and toenails. All findings within normal limits unless otherwise noted below.   Relevant physical exam findings are noted in the Assessment and Plan.  Left Lower Back 0.6 cm irregular brown macule  Hands (5) Erythematous thin papules/macules with gritty scale.   Assessment & Plan   SKIN CANCER SCREENING PERFORMED TODAY.  ACTINIC DAMAGE - Chronic condition, secondary to cumulative UV/sun exposure - diffuse scaly erythematous macules with underlying dyspigmentation - Recommend daily broad spectrum sunscreen SPF 30+ to sun-exposed areas, reapply every 2 hours as needed.  - Staying in the shade or wearing long sleeves, sun glasses (UVA+UVB protection) and wide brim hats (4-inch brim around the entire circumference of the hat) are also recommended for sun protection.  - Call for new or changing lesions.  LENTIGINES, SEBORRHEIC KERATOSES, HEMANGIOMAS - Benign normal skin lesions - Benign-appearing -  Call for any changes  MELANOCYTIC NEVI - Tan-brown and/or pink-flesh-colored symmetric macules and papules - Benign appearing on exam today - Observation - Call clinic for new or changing moles - Recommend daily use of broad spectrum spf 30+ sunscreen to sun-exposed areas.       NEOPLASM OF UNCERTAIN BEHAVIOR OF SKIN Left Lower Back - Epidermal / dermal shaving  Lesion diameter (cm):  0.6 Informed consent: discussed and consent obtained   Timeout: patient name, date of birth, surgical site, and procedure verified   Procedure prep:  Patient was prepped and draped in usual sterile fashion Prep type:  Isopropyl alcohol Anesthesia: the lesion was anesthetized in a standard fashion   Anesthetic:  1% lidocaine  w/ epinephrine  1-100,000 buffered w/ 8.4% NaHCO3 Instrument used: flexible razor blade   Hemostasis achieved with: pressure, aluminum chloride and electrodesiccation   Outcome: patient tolerated procedure well   Post-procedure details: sterile dressing applied and wound care instructions given   Dressing type: bandage and petrolatum    Specimen 1 - Surgical pathology Differential Diagnosis: Nevus vs dysplastic nevus R/O MM  Check Margins: No AK (ACTINIC KERATOSIS) (5) Hands (5) - Destruction of lesion - Hands (5) Complexity: simple   Destruction method: cryotherapy   Informed consent: discussed and consent obtained   Timeout:  patient name, date of birth, surgical site, and procedure verified Lesion destroyed using liquid nitrogen: Yes   Region frozen until ice ball extended beyond lesion: Yes   Outcome: patient tolerated procedure well with no complications   Post-procedure details: wound care instructions given    SCREENING EXAM FOR SKIN CANCER   CHERRY ANGIOMA   ACTINIC SKIN DAMAGE   LENTIGINES  MULTIPLE BENIGN NEVI   SEBORRHEIC KERATOSIS   Return in about 6 months (around 12/21/2024) for TBSE.  I, Roseline Hutchinson, CMA, am acting as scribe for  Sylus Stgermain K, PA-C .   Documentation: I have reviewed the above documentation for accuracy and completeness, and I agree with the above.  Kaiea Esselman K, PA-C    "

## 2024-06-23 NOTE — Patient Instructions (Signed)

## 2024-06-24 ENCOUNTER — Ambulatory Visit: Payer: Self-pay | Admitting: Physician Assistant

## 2024-06-24 LAB — SURGICAL PATHOLOGY

## 2024-09-01 ENCOUNTER — Other Ambulatory Visit

## 2024-09-08 ENCOUNTER — Encounter: Admitting: Family Medicine

## 2024-12-28 ENCOUNTER — Ambulatory Visit: Admitting: Physician Assistant
